# Patient Record
Sex: Female | Born: 1990 | Race: White | Hispanic: Yes | Marital: Single | Smoking: Current every day smoker
Health system: Southern US, Community
[De-identification: ages and names within clinical notes are randomized; demographics above are authoritative.]

## PROBLEM LIST (undated history)

## (undated) DIAGNOSIS — K219 Gastro-esophageal reflux disease without esophagitis: Secondary | ICD-10-CM

## (undated) HISTORY — PX: HERNIA REPAIR: SHX51

## (undated) HISTORY — DX: Gastro-esophageal reflux disease without esophagitis: K21.9

---
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
## 2012-11-07 ENCOUNTER — Encounter: Payer: Self-pay | Admitting: Internal Medicine

## 2013-01-20 ENCOUNTER — Ambulatory Visit (INDEPENDENT_AMBULATORY_CARE_PROVIDER_SITE_OTHER): Payer: BC Managed Care – PPO | Admitting: Family Medicine

## 2013-01-20 VITALS — BP 120/70 | HR 87 | Temp 98.8°F | Resp 16 | Ht 65.0 in | Wt 151.0 lb

## 2013-01-20 DIAGNOSIS — Z23 Encounter for immunization: Secondary | ICD-10-CM

## 2013-01-20 NOTE — Progress Notes (Signed)
  Subjective:    Patient ID: Brittany Frazier, female    DOB: [birth year], 22 y.o.   MRN: [id number]  HPI  Patient here from [place] and needs immunization review. She was told by the immigration authorities that she needed an MMR because of her titers were too low. She receives T. gap last Wednesday  Review of Systems     Objective:   Physical Exam We discussed immunization situation       Assessment & Plan:  Need for MMR vaccine - Plan: MMR vaccine subcutaneous

## 2013-05-07 ENCOUNTER — Ambulatory Visit (INDEPENDENT_AMBULATORY_CARE_PROVIDER_SITE_OTHER): Payer: BC Managed Care – PPO | Admitting: Physician Assistant

## 2013-05-07 VITALS — BP 122/64 | HR 66 | Temp 98.4°F | Resp 12 | Ht 65.0 in | Wt 163.0 lb

## 2013-05-07 DIAGNOSIS — Z9189 Other specified personal risk factors, not elsewhere classified: Secondary | ICD-10-CM

## 2013-05-07 DIAGNOSIS — Z111 Encounter for screening for respiratory tuberculosis: Secondary | ICD-10-CM

## 2013-05-07 DIAGNOSIS — Z2839 Other underimmunization status: Secondary | ICD-10-CM

## 2013-05-07 DIAGNOSIS — M549 Dorsalgia, unspecified: Secondary | ICD-10-CM

## 2013-05-07 DIAGNOSIS — Z0000 Encounter for general adult medical examination without abnormal findings: Secondary | ICD-10-CM

## 2013-05-07 NOTE — Progress Notes (Signed)
  Tuberculosis Risk Questionnaire  1. Were you born outside the [place] in one of the following parts of the world: [place], [place], [place], [place] or [place]?  Yes   2. Have you traveled outside the [place] and lived for more than one month in one of the following parts of the world: [place], [place], [place], [place] or [place]?  Yes   3. Do you have a compromised immune system such as from any of the following conditions:HIV/AIDS, organ or bone marrow transplantation, diabetes, immunosuppressive medicines (e.g. Prednisone, Remicaide), leukemia, lymphoma, cancer of the head or neck, gastrectomy or jejunal bypass, end-stage renal disease (on dialysis), or silicosis?  No   4. Have you ever or do you plan on working in: a residential care center, a health care facility, a jail or prison or homeless shelter?  No  5. Have you ever: injected illegal drugs, used crack cocaine, lived in a homeless shelter  or been in jail or prison?   No  6. Have you ever been exposed to anyone with infectious tuberculosis?  No   Tuberculosis Symptom Questionnaire  Do you currently have any of the following symptoms?  1. Unexplained cough lasting more than 3 weeks? No  2. Unexplained fever lasting more than 3 weeks. No   3. Night Sweats (sweating that leaves the bedclothes and sheets wet)   No  4. Shortness of Breath No  5. Chest Pain No  6. Unintentional weight loss  No  7. Unexplained fatigue (very tired for no reason) No

## 2013-05-07 NOTE — Patient Instructions (Addendum)
Contact the other office to get documentation of vaccines/titers they did on letterhead or with a stamp.  Keeping You Healthy  Get These Tests 1. Blood Pressure- Have your blood pressure checked once a year by your health care provider.  Normal blood pressure is 120/80. 2. Weight- Have your body mass index (BMI) calculated to screen for obesity.  BMI is measure of body fat based on height and weight.  You can also calculate your own BMI at https://www.west-esparza.com/. 3. Cholesterol- Have your cholesterol checked every 5 years starting at age 92 then yearly starting at age 36. 4. Chlamydia, HIV, and other sexually transmitted diseases- Get screened every year until age 72, then within three months of each new sexual provider. 5. Pap Smear- Beginning at age 42, every 1-3 years; discuss with your health care provider. 6. Mammogram- Every year starting at age 68  Take these medicines  Calcium with Vitamin D-Your body needs 1200 mg of Calcium each day and [phone] IU of Vitamin D daily.  Your body can only absorb 500 mg of Calcium at a time so Calcium must be taken in 2 or 3 divided doses throughout the day.  Multivitamin with folic acid- Once daily if it is possible for you to become pregnant.  Get these Immunizations  Gardasil-Series of three doses; prevents HPV related illness such as genital warts and cervical cancer.  Menactra-Single dose; prevents meningitis.  Tetanus shot- Every 10 years.  Flu shot-Every year.  Take these steps 1. Do not smoke-Your healthcare provider can help you quit.  For tips on how to quit go to www.smokefree.gov or call 1-800 QUITNOW. 2. Be physically active- Exercise 5 days a week for at least 30 minutes.  If you are not already physically active, start slow and gradually work up to 30 minutes of moderate physical activity.  Examples of moderate activity include walking briskly, dancing, swimming, bicycling, etc. 3. Breast Cancer- A self breast exam every month  is important for early detection of breast cancer.  For more information and instruction on self breast exams, ask your healthcare provider or [email or web address].[email or web address]. 4. Eat a healthy diet- Eat a variety of healthy foods such as fruits, vegetables, whole grains, low fat milk, low fat cheeses, yogurt, lean meats, poultry and fish, beans, nuts, tofu, etc.  For more information go to www. Thenutritionsource.org 5. Drink alcohol in moderation- Limit alcohol intake to one drink or less per day. Never drink and drive. 6. Depression- Your emotional health is as important as your physical health.  If you're feeling down or losing interest in things you normally enjoy please talk to your healthcare provider about being screened for depression. 7. Dental visit- Brush and floss your teeth twice daily; visit your dentist twice a year. 8. Eye doctor- Get an eye exam at least every 2 years. 9. Helmet use- Always wear a helmet when riding a bicycle, motorcycle, rollerblading or skateboarding. 10. Safe sex- If you may be exposed to sexually transmitted infections, use a condom. 11. Seat belts- Seat belts can save your live; always wear one. 12. Smoke/Carbon Monoxide detectors- These detectors need to be installed on the appropriate level of your home. Replace batteries at least once a year. 13. Skin cancer- When out in the sun please cover up and use sunscreen 15 SPF or higher. 14. Violence- If anyone is threatening or hurting you, please tell your healthcare provider.

## 2013-05-07 NOTE — Progress Notes (Signed)
  Subjective:    Patient ID: Brittany Frazier, female    DOB: 1991/01/31, 22 y.o.   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 Pt states she had a physical earlier this year for visa purposes. She is unable to remember the name of the practice but does know where it is located. She brings vaccine records from the aforementioned clinic, but they are not verifiable. States she is overall healthy, PMH of GERD, currently taking spirolactone Yaz and metformin for acne and weight gain (managed by an endocrinologist). Pt complains of back pain that worsens with activity, intermittent, right side of spine in lower back.   Review of Systems Denies: headache, dizziness, SOB, chest pain, abdominal pain, urinary symptoms    Objective:   Physical Exam  BP 122/64  Pulse 66  Temp(Src) 98.4 F (36.9 C) (Oral)  Resp 12  Ht 5\' 5"  (1.651 m)  Wt 163 lb (73.936 kg)  BMI 27.12 kg/m2  SpO2 100%  General: WDWN female, appears stated age, NAD Head: normocephalic, atraumatic Eyes: PERLA, sclera/conjunctiva clear Ears: TMs clear with visible bony landmarks Nose: turbinates normal, no visible discharge  Throat: moist mucous membranes, uvula midline, posterior pharynx is without erythema edema or exudate  Neck: supple, no palpable lymphadenopathy Resp: good air entry, CTA anterior & posterior fields bilaterally, without rales rhonchi wheezes Cardiac: RRR, S1S2 appreciated, no murmurs rubs gallops Breast: no palpable masses, no tenderness  Abdomen: normal bowel sounds, soft, non distended, non tender to palpation, no Murphys, no rebound, no guarding Extremities: moves all limbs spontaneously, radial & dorsalis pedis pulses intact, good capillary refill, 2+ reflexes in bicep brachioradialis patellar & achilles, lower back muscles have moderate tension, full ROM in spine, no step offs appreciated, no spinal bony tenderness Neuro: A&O x 3, CN II-XII grossly intact  Skin:  moderate cystic acne present on face, chest, back - skin is otherwise clear of rashes or lesions      Assessment & Plan:  Routine general medical examination at a health care facility  Immunizations incomplete - Plan: Hepatitis B surface antibody, Measles/Mumps/Rubella Immunity  Back pain  Suspect back pain is from weak abdominal muscles and the weight of breast tissue. Counseled patient on use of supportive bras and abdominal muscle strengthening.

## 2013-05-08 NOTE — Progress Notes (Signed)
I have examined this patient along with the student and agree.  Her school requires a 2-step PPD (with the doses no more than 30 days apart).  As her last PPD was 12/2012, we placed one today, and return for reading in 48-72 hours, then return in 1 week for the second step. She'll get the records from the other facility, with a clinic stamp or on letterhead, which will document immunity to varicella and her influenza vaccine. She had a booster dose of MMR 12/2012 here, and Tdap in 08/2007 (and another unverified dose 12/2012 at the other facility) and believes she had the Hep B vaccine series, so we'll get titers to verify immunity to MMR and Hep B.  I suspect that a lot of her back pain is due to very large breasts and the resultant strain.  She wears two bras to achieve adequate support.  Discussed the importance of supportive bras and appropriate fit, and core strength to help reduce her back pain.

## 2013-05-09 ENCOUNTER — Ambulatory Visit: Payer: BC Managed Care – PPO

## 2013-05-09 ENCOUNTER — Ambulatory Visit (INDEPENDENT_AMBULATORY_CARE_PROVIDER_SITE_OTHER): Payer: BC Managed Care – PPO | Admitting: Physician Assistant

## 2013-05-09 ENCOUNTER — Encounter: Payer: Self-pay | Admitting: Physician Assistant

## 2013-05-09 DIAGNOSIS — Z111 Encounter for screening for respiratory tuberculosis: Secondary | ICD-10-CM

## 2013-05-09 DIAGNOSIS — Z789 Other specified health status: Secondary | ICD-10-CM | POA: Insufficient documentation

## 2013-05-09 LAB — MEASLES/MUMPS/RUBELLA IMMUNITY
Mumps IgG: >300 AU/mL — ABNORMAL HIGH (ref <9.00)
Rubella: 6.59 Index — ABNORMAL HIGH (ref <0.90)
Rubeola IgG: 256 AU/mL — ABNORMAL HIGH (ref <25.00)

## 2013-05-09 LAB — RPR: RPR Ser-Titr: NONREACTIVE

## 2013-05-09 LAB — HEPATITIS B SURFACE ANTIBODY, QUANTITATIVE: Hepatitis B-Post: 67.7 m[IU]/mL

## 2013-05-09 NOTE — Progress Notes (Signed)
PPD reading only

## 2014-05-14 ENCOUNTER — Ambulatory Visit (INDEPENDENT_AMBULATORY_CARE_PROVIDER_SITE_OTHER): Payer: BC Managed Care – PPO | Admitting: Physician Assistant

## 2014-05-14 VITALS — BP 110/68 | HR 102 | Temp 98.0°F | Resp 18 | Ht 65.0 in | Wt 166.0 lb

## 2014-05-14 DIAGNOSIS — Z0000 Encounter for general adult medical examination without abnormal findings: Secondary | ICD-10-CM

## 2014-05-14 DIAGNOSIS — Z111 Encounter for screening for respiratory tuberculosis: Secondary | ICD-10-CM

## 2014-05-14 NOTE — Progress Notes (Signed)
   Subjective:    Patient ID: Brittany Frazier, female    DOB: April 22, 1991, 23 y.o.   MRN: [id number]  HPI  Brittany Frazier is a very pleasant 23 yr old female here for CPE.  Last CPE in 2014.  Needs ppw completed for school  Complaints:  none LMP:  04/13/14 - regular Contraception:  None Not currently sexually active, no concern for STI Dentist:  Once per year Eye doctor:  none Imm:  utd Diet:  Varied; limited junk food; cut out soda; mostly water to drink Exercise:  3x/wk Zumba Meds:  accutane Family history:  PGF with diabetes;  Tobacco: none Etoh:  None currently due to accutane  [facility] - radiography    Review of Systems  Constitutional: Negative for fever, chills, appetite change and fatigue.  Eyes: Negative for visual disturbance.  Respiratory: Negative.  Negative for cough and wheezing.   Cardiovascular: Negative for chest pain.  Gastrointestinal: Negative for nausea, vomiting and abdominal pain.  Genitourinary: Negative for dysuria, vaginal bleeding and vaginal discharge.  Musculoskeletal: Negative for arthralgias.  Skin: Negative.        Objective:   Physical Exam  Vitals reviewed. Constitutional: She is oriented to person, place, and time. She appears well-developed and well-nourished. No distress.  HENT:  Head: Normocephalic and atraumatic.  Right Ear: Tympanic membrane and ear canal normal.  Left Ear: Tympanic membrane and ear canal normal.  Mouth/Throat: Uvula is midline, oropharynx is clear and moist and mucous membranes are normal.  Eyes: Conjunctivae and EOM are normal. Pupils are equal, round, and reactive to light. No scleral icterus.  Neck: Normal range of motion. Neck supple.  Cardiovascular: Normal rate, regular rhythm and normal heart sounds.   No murmur heard. Pulmonary/Chest: Effort normal and breath sounds normal. She has no wheezes. She has no rales.  Abdominal: Soft. Bowel sounds are normal. There is no tenderness.  Lymphadenopathy:    She has no  cervical adenopathy.  Neurological: She is alert and oriented to person, place, and time. She has normal reflexes.  Skin: Skin is warm and dry.  Psychiatric: She has a normal mood and affect. Her behavior is normal.    Immunization History  Administered Date(s) Administered  . Influenza Split 01/19/2013  . MMR 01/20/2013  . PPD Test 01/19/2013, 05/07/2013  . Td 01/16/2013  . Tdap 08/04/2007       Assessment & Plan:  Routine general medical examination at a health care facility  Screening for tuberculosis - Plan: Quantiferon tb gold assay    Brittany Frazier is a very pleasant 23 yr old female here for CPE.  She reports good health and exam is normal today.  She is utd on required immunizations for school.  PPW complete and copies sent for scanning.  TB quantiferon gold sent per school requirements - will follow up on lab results.    Brittany Frazier MHS, PA-C [facility] 7/14/20159:10 PM

## 2014-05-14 NOTE — Patient Instructions (Signed)

## 2014-05-14 NOTE — Progress Notes (Signed)
  Tuberculosis Risk Questionnaire  1. Yes  Were you born outside the [place] in one of the following parts of the world: [place], [place], [place], [place] or [place]?    2. Yes  Have you traveled outside the [place] and lived for more than one month in one of the following parts of the world: [place], [place], [place], [place] or [place]?    3. No Do you have a compromised immune system such as from any of the following conditions:HIV/AIDS, organ or bone marrow transplantation, diabetes, immunosuppressive medicines (e.g. Prednisone, Remicaide), leukemia, lymphoma, cancer of the head or neck, gastrectomy or jejunal bypass, end-stage renal disease (on dialysis), or silicosis?     4. Yes  Have you ever or do you plan on working in: a residential care center, a health care facility, a jail or prison or homeless shelter?    5. No Have you ever: injected illegal drugs, used crack cocaine, lived in a homeless shelter  or been in jail or prison?     6. No Have you ever been exposed to anyone with infectious tuberculosis?    Tuberculosis Symptom Questionnaire  Do you currently have any of the following symptoms?  1. No Unexplained cough lasting more than 3 weeks?   2. No Unexplained fever lasting more than 3 weeks.   3. No Night Sweats (sweating that leaves the bedclothes and sheets wet)     4. No Shortness of Breath   5. No Chest Pain   6. No Unintentional weight loss    7. No Unexplained fatigue (very tired for no reason)   

## 2014-05-16 LAB — QUANTIFERON TB GOLD ASSAY (BLOOD)
INTERFERON GAMMA RELEASE ASSAY: NEGATIVE
QUANTIFERON TB AG MINUS NIL: 0 [IU]/mL
Quantiferon Nil Value: 0.02 IU/mL
TB Ag value: 0.02 IU/mL

## 2014-05-19 ENCOUNTER — Telehealth: Payer: Self-pay

## 2014-05-19 NOTE — Telephone Encounter (Signed)
PATIENT IS CALLING ABOUT HER BLOOD TEST RESULTS FOR [facility]. PLEASE CALL! [phone]

## 2014-05-19 NOTE — Telephone Encounter (Signed)
Pt called back. Copy of Quant Gold up front for p/u

## 2014-05-19 NOTE — Telephone Encounter (Signed)
Pt was notified of labs earlier. LM to inquire as to whether she wants to p/u a copy or if she wants us to mail her one.

## 2014-07-08 ENCOUNTER — Ambulatory Visit (INDEPENDENT_AMBULATORY_CARE_PROVIDER_SITE_OTHER): Payer: BC Managed Care – PPO | Admitting: *Deleted

## 2014-07-08 DIAGNOSIS — Z23 Encounter for immunization: Secondary | ICD-10-CM

## 2016-03-08 ENCOUNTER — Ambulatory Visit (INDEPENDENT_AMBULATORY_CARE_PROVIDER_SITE_OTHER): Payer: BC Managed Care – PPO | Admitting: Family Medicine

## 2016-03-08 VITALS — BP 118/80 | HR 79 | Temp 97.2°F | Resp 18 | Ht 65.0 in | Wt 174.0 lb

## 2016-03-08 DIAGNOSIS — R197 Diarrhea, unspecified: Secondary | ICD-10-CM | POA: Diagnosis not present

## 2016-03-08 DIAGNOSIS — R109 Unspecified abdominal pain: Secondary | ICD-10-CM

## 2016-03-08 DIAGNOSIS — R110 Nausea: Secondary | ICD-10-CM

## 2016-03-08 LAB — POCT CBC
Granulocyte percent: 63.8 %G (ref 37–80)
HCT, POC: 40.9 % (ref 37.7–47.9)
HEMOGLOBIN: 14.6 g/dL (ref 12.2–16.2)
LYMPH, POC: 2.1 (ref 0.6–3.4)
MCH: 33 pg — AB (ref 27–31.2)
MCHC: 35.6 g/dL — AB (ref 31.8–35.4)
MCV: 92.8 fL (ref 80–97)
MID (cbc): 0.3 (ref 0–0.9)
MPV: 7.8 fL (ref 0–99.8)
PLATELET COUNT, POC: 326 10*3/uL (ref 142–424)
POC Granulocyte: 4.3 (ref 2–6.9)
POC LYMPH PERCENT: 31.6 %L (ref 10–50)
POC MID %: 4.6 %M (ref 0–12)
RBC: 4.41 M/uL (ref 4.04–5.48)
RDW, POC: 12 %
WBC: 6.7 10*3/uL (ref 4.6–10.2)

## 2016-03-08 LAB — POCT URINALYSIS DIP (MANUAL ENTRY)
BILIRUBIN UA: NEGATIVE
GLUCOSE UA: NEGATIVE
Leukocytes, UA: NEGATIVE
Nitrite, UA: NEGATIVE
Protein Ur, POC: NEGATIVE
UROBILINOGEN UA: 0.2
pH, UA: 5.5

## 2016-03-08 LAB — POC MICROSCOPIC URINALYSIS (UMFC)

## 2016-03-08 MED ORDER — DICYCLOMINE HCL 10 MG PO CAPS: 10.0000 mg | ORAL_CAPSULE | Freq: Three times a day (TID) | ORAL | Status: DC

## 2016-03-08 MED ORDER — ONDANSETRON 4 MG PO TBDP: 4.0000 mg | ORAL_TABLET | Freq: Three times a day (TID) | ORAL | Status: DC | PRN

## 2016-03-08 NOTE — Progress Notes (Signed)
Patient ID: Brittany HellerDiana Frazier, female    DOB: [birth year]  Age: 25 y.o. MRN: [id number]  Chief Complaint  Patient presents with  . Diarrhea    x 3 days  . Nausea    x 2 days  . Abdominal Pain    Subjective:   Patient had a problem with her TMJ 6 weeks ago and was treated with some steroids at that time. After that she had the flu. That subsided and then about 10 days or 2 weeks ago she developed diarrhea. That has continued to persist with going to 12 times a day. She has had nausea without vomiting. She has low abdominal pain. She has minimized her eating. She has kept well hydrated. She has been on a ketogenic diet trying to lose some weight.  Current allergies, medications, problem list, past/family and social histories reviewed.  Objective:  BP 118/80 mmHg  Pulse 79  Temp(Src) 97.2 F (36.2 C) (Oral)  Resp 18  Ht 5\' 5"  (1.651 m)  Wt 174 lb (78.926 kg)  BMI 28.96 kg/m2  SpO2 99%  LMP 03/04/2016  No acute distress. Throat clear. TMs normal. Neck supple without nodes. Chest clear. Heart regular without murmur. Abdomen has normal bowel sounds. Soft without organomegaly or masses is a little tender across the lower abdomen.  Assessment & Plan:   Assessment: 1. Diarrhea, unspecified type   2. Nausea without vomiting   3. Abdominal pain, unspecified abdominal location       Plan: Check labs and stool cultures.  Orders Placed This Encounter  Procedures  . Stool culture  . Clostridium Difficile by PCR    Order Specific Question:  Is your patient experiencing loose or watery stools (3 or more in 24 hours)?    Answer:  Yes    Order Specific Question:  Has the patient received laxatives in the last 24 hours?    Answer:  No    Order Specific Question:  Has a negative Cdiff test resulted in the last 7 days?    Answer:  No  . POCT CBC  . POCT Microscopic Urinalysis (UMFC)  . POCT urinalysis dipstick    Meds ordered this encounter  Medications  . dicyclomine (BENTYL) 10 MG  capsule    Sig: Take 1 capsule (10 mg total) by mouth 4 (four) times daily -  before meals and at bedtime.    Dispense:  40 capsule    Refill:  1  . ondansetron (ZOFRAN ODT) 4 MG disintegrating tablet    Sig: Take 1 tablet (4 mg total) by mouth every 8 (eight) hours as needed for nausea or vomiting.    Dispense:  20 tablet    Refill:  0    Results for orders placed or performed in visit on 03/08/16  POCT CBC  Result Value Ref Range   WBC 6.7 4.6 - 10.2 K/uL   Lymph, poc 2.1 0.6 - 3.4   POC LYMPH PERCENT 31.6 10 - 50 %L   MID (cbc) 0.3 0 - 0.9   POC MID % 4.6 0 - 12 %M   POC Granulocyte 4.3 2 - 6.9   Granulocyte percent 63.8 37 - 80 %G   RBC 4.41 4.04 - 5.48 M/uL   Hemoglobin 14.6 12.2 - 16.2 g/dL   HCT, POC 40.940.9 81.137.7 - 47.9 %   MCV 92.8 80 - 97 fL   MCH, POC 33.0 (A) 27 - 31.2 pg   MCHC 35.6 (A) 31.8 - 35.4 g/dL   RDW,  POC 12.0 %   Platelet Count, POC 326 142 - 424 K/uL   MPV 7.8 0 - 99.8 fL  POCT Microscopic Urinalysis ([facility])  Result Value Ref Range   WBC,UR,HPF,POC None None WBC/hpf   RBC,UR,HPF,POC Few (A) None RBC/hpf   Bacteria Few (A) None, Too numerous to count   Mucus Present (A) Absent   Epithelial Cells, UR Per Microscopy None None, Too numerous to count cells/hpf  POCT urinalysis dipstick  Result Value Ref Range   Color, UA yellow yellow   Clarity, UA clear clear   Glucose, UA negative negative   Bilirubin, UA negative negative   Ketones, POC UA moderate (40) (A) negative   Spec Grav, UA <=1.005    Blood, UA small (A) negative   pH, UA 5.5    Protein Ur, POC negative negative   Urobilinogen, UA 0.2    Nitrite, UA Negative Negative   Leukocytes, UA Negative Negative    Nothing appears terribly out of the ordinary except for the ketones in the urine which I discussed with her. She is to take the dicyclomine and see if we can get her bowels back to more normal motility. Return if not improving. See orders and instructions.    Patient Instructions    Continue to keep yourself well hydrated  Take the dicyclomine 10 mg before meals and at bedtime as needed for abdominal pain and diarrhea. If necessary you can increase it to 20 mg.  Return if worse or not improving  We will try and let you know the results of your labs as soon as they become available.  IF you received an x-ray today, you will receive an invoice from [facility]. Please contact [facility] at [phone] with questions or concerns regarding your invoice.   IF you received labwork today, you will receive an invoice from [employer]. Please contact [facility] at [phone] with questions or concerns regarding your invoice.   Our billing staff will not be able to assist you with questions regarding bills from these companies.  You will be contacted with the lab results as soon as they are available. The fastest way to get your results is to activate your My Chart account. Instructions are located on the last page of this paperwork. If you have not heard from [place] regarding the results in 2 weeks, please contact this office.           Return if symptoms worsen or fail to improve.   Izaih Kataoka, MD 03/08/2016

## 2016-03-08 NOTE — Patient Instructions (Addendum)
Continue to keep yourself well hydrated  Take the dicyclomine 10 mg before meals and at bedtime as needed for abdominal pain and diarrhea. If necessary you can increase it to 20 mg.  Return if worse or not improving  We will try and let you know the results of your labs as soon as they become available.  IF you received an x-ray today, you will receive an invoice from [facility]. Please contact [facility] at [phone] with questions or concerns regarding your invoice.   IF you received labwork today, you will receive an invoice from [facility]. Please contact [facility] at [phone] with questions or concerns regarding your invoice.   Our billing staff will not be able to assist you with questions regarding bills from these companies.  You will be contacted with the lab results as soon as they are available. The fastest way to get your results is to activate your My Chart account. Instructions are located on the last page of this paperwork. If you have not heard from us regarding the results in 2 weeks, please contact this office.

## 2016-03-09 ENCOUNTER — Telehealth: Payer: Self-pay | Admitting: *Deleted

## 2016-03-09 DIAGNOSIS — A0472 Enterocolitis due to Clostridium difficile, not specified as recurrent: Secondary | ICD-10-CM

## 2016-03-09 LAB — CLOSTRIDIUM DIFFICILE BY PCR: Toxigenic C. Difficile by PCR: DETECTED — CR

## 2016-03-09 MED ORDER — METRONIDAZOLE 500 MG PO TABS: 500.0000 mg | ORAL_TABLET | Freq: Three times a day (TID) | ORAL | Status: AC

## 2016-03-09 NOTE — Telephone Encounter (Signed)
Spoke to pt and informed of results

## 2016-03-09 NOTE — Telephone Encounter (Signed)
[facility] called and stated that pt is positive for C-Diff.

## 2016-03-09 NOTE — Telephone Encounter (Signed)
I tried calling patient however voicemail was full and I could not leave a message. She was positive for C diff.  I have called in metronidazole for her -- she will need to take three times a day for the next 10 days. She needs to be aware that she will get very sick if she drinks alcohol while on this medication. She should take antibiotic with either daily yogurt or a probiotic tablet to protect her gut. Be very diligent about hand washing to prevent transmission. Return if symptoms worsen or symptoms not improved after treatment complete.

## 2016-03-12 LAB — STOOL CULTURE

## 2016-03-25 ENCOUNTER — Telehealth: Payer: Self-pay

## 2016-03-25 NOTE — Telephone Encounter (Signed)
Pt wants to follow up from 2 weeks ago.Today she is feeling great since she took her antibiotics. She will be finishing her last pill today. She does have minor stomach problems possibly due to stress but overall she feels way better than 2 weeks ago.  Please advise if needed  [phone]

## 2018-02-06 DIAGNOSIS — F902 Attention-deficit hyperactivity disorder, combined type: Secondary | ICD-10-CM | POA: Diagnosis not present

## 2018-02-06 DIAGNOSIS — F329 Major depressive disorder, single episode, unspecified: Secondary | ICD-10-CM | POA: Diagnosis not present

## 2018-02-06 DIAGNOSIS — F411 Generalized anxiety disorder: Secondary | ICD-10-CM | POA: Diagnosis not present

## 2018-04-07 ENCOUNTER — Ambulatory Visit (INDEPENDENT_AMBULATORY_CARE_PROVIDER_SITE_OTHER): Payer: 59 | Admitting: Physician Assistant

## 2018-04-07 ENCOUNTER — Other Ambulatory Visit: Payer: Self-pay

## 2018-04-07 ENCOUNTER — Encounter: Payer: Self-pay | Admitting: Physician Assistant

## 2018-04-07 VITALS — BP 110/68 | Temp 98.4°F | Resp 16 | Ht 65.0 in | Wt 142.8 lb

## 2018-04-07 DIAGNOSIS — Z113 Encounter for screening for infections with a predominantly sexual mode of transmission: Secondary | ICD-10-CM | POA: Diagnosis not present

## 2018-04-07 DIAGNOSIS — Z1329 Encounter for screening for other suspected endocrine disorder: Secondary | ICD-10-CM | POA: Diagnosis not present

## 2018-04-07 DIAGNOSIS — Z13228 Encounter for screening for other metabolic disorders: Secondary | ICD-10-CM | POA: Diagnosis not present

## 2018-04-07 DIAGNOSIS — Z0000 Encounter for general adult medical examination without abnormal findings: Secondary | ICD-10-CM

## 2018-04-07 DIAGNOSIS — Z130 Encounter for screening for diseases of the blood and blood-forming organs and certain disorders involving the immune mechanism: Secondary | ICD-10-CM

## 2018-04-07 DIAGNOSIS — Z309 Encounter for contraceptive management, unspecified: Secondary | ICD-10-CM | POA: Diagnosis not present

## 2018-04-07 DIAGNOSIS — Z13220 Encounter for screening for lipoid disorders: Secondary | ICD-10-CM | POA: Diagnosis not present

## 2018-04-07 LAB — POCT URINALYSIS DIP (MANUAL ENTRY)
Bilirubin, UA: NEGATIVE
Glucose, UA: NEGATIVE mg/dL
Ketones, POC UA: NEGATIVE mg/dL
Nitrite, UA: NEGATIVE
Protein Ur, POC: NEGATIVE mg/dL
Spec Grav, UA: 1.01 (ref 1.010–1.025)
Urobilinogen, UA: 0.2 E.U./dL
pH, UA: 6.5 (ref 5.0–8.0)

## 2018-04-07 LAB — POCT URINE PREGNANCY: Preg Test, Ur: NEGATIVE

## 2018-04-07 MED ORDER — NORETHIN ACE-ETH ESTRAD-FE 1-20 MG-MCG PO TABS: 1.0000 | ORAL_TABLET | Freq: Every day | ORAL | 11 refills | Status: DC

## 2018-04-07 NOTE — Patient Instructions (Addendum)
We will contact you with the results of your lab work.   Health Maintenance, Female Adopting a healthy lifestyle and getting preventive care can go a long way to promote health and wellness. Talk with your health care provider about what schedule of regular examinations is right for you. This is a good chance for you to check in with your provider about disease prevention and staying healthy. In between checkups, there are plenty of things you can do on your own. Experts have done a lot of research about which lifestyle changes and preventive measures are most likely to keep you healthy. Ask your health care provider for more information. Weight and diet Eat a healthy diet  Be sure to include plenty of vegetables, fruits, low-fat dairy products, and lean protein.  Do not eat a lot of foods high in solid fats, added sugars, or salt.  Get regular exercise. This is one of the most important things you can do for your health. ? Most adults should exercise for at least 150 minutes each week. The exercise should increase your heart rate and make you sweat (moderate-intensity exercise). ? Most adults should also do strengthening exercises at least twice a week. This is in addition to the moderate-intensity exercise.  Maintain a healthy weight  Body mass index (BMI) is a measurement that can be used to identify possible weight problems. It estimates body fat based on height and weight. Your health care provider can help determine your BMI and help you achieve or maintain a healthy weight.  For females 97 years of age and older: ? A BMI below 18.5 is considered underweight. ? A BMI of 18.5 to 24.9 is normal. ? A BMI of 25 to 29.9 is considered overweight. ? A BMI of 30 and above is considered obese.  Watch levels of cholesterol and blood lipids  You should start having your blood tested for lipids and cholesterol at 25 years of age, then have this test every 5 years.  You may need to have your  cholesterol levels checked more often if: ? Your lipid or cholesterol levels are high. ? You are older than 25 years of age. ? You are at high risk for heart disease.  Cancer screening Lung Cancer  Lung cancer screening is recommended for adults 73-35 years old who are at high risk for lung cancer because of a history of smoking.  A yearly low-dose CT scan of the lungs is recommended for people who: ? Currently smoke. ? Have quit within the past 15 years. ? Have at least a 30-pack-year history of smoking. A pack year is smoking an average of one pack of cigarettes a day for 1 year.  Yearly screening should continue until it has been 15 years since you quit.  Yearly screening should stop if you develop a health problem that would prevent you from having lung cancer treatment.  Breast Cancer  Practice breast self-awareness. This means understanding how your breasts normally appear and feel.  It also means doing regular breast self-exams. Let your health care provider know about any changes, no matter how small.  If you are in your 20s or 30s, you should have a clinical breast exam (CBE) by a health care provider every 1-3 years as part of a regular health exam.  If you are 32 or older, have a CBE every year. Also consider having a breast X-ray (mammogram) every year.  If you have a family history of breast cancer, talk to your  health care provider about genetic screening.  If you are at high risk for breast cancer, talk to your health care provider about having an MRI and a mammogram every year.  Breast cancer gene (BRCA) assessment is recommended for women who have family members with BRCA-related cancers. BRCA-related cancers include: ? Breast. ? Ovarian. ? Tubal. ? Peritoneal cancers.  Results of the assessment will determine the need for genetic counseling and BRCA1 and BRCA2 testing.  Cervical Cancer Your health care provider may recommend that you be screened regularly  for cancer of the pelvic organs (ovaries, uterus, and vagina). This screening involves a pelvic examination, including checking for microscopic changes to the surface of your cervix (Pap test). You may be encouraged to have this screening done every 3 years, beginning at age 79.  For women ages 29-65, health care providers may recommend pelvic exams and Pap testing every 3 years, or they may recommend the Pap and pelvic exam, combined with testing for human papilloma virus (HPV), every 5 years. Some types of HPV increase your risk of cervical cancer. Testing for HPV may also be done on women of any age with unclear Pap test results.  Other health care providers may not recommend any screening for nonpregnant women who are considered low risk for pelvic cancer and who do not have symptoms. Ask your health care provider if a screening pelvic exam is right for you.  If you have had past treatment for cervical cancer or a condition that could lead to cancer, you need Pap tests and screening for cancer for at least 20 years after your treatment. If Pap tests have been discontinued, your risk factors (such as having a new sexual partner) need to be reassessed to determine if screening should resume. Some women have medical problems that increase the chance of getting cervical cancer. In these cases, your health care provider may recommend more frequent screening and Pap tests.  Colorectal Cancer  This type of cancer can be detected and often prevented.  Routine colorectal cancer screening usually begins at 25 years of age and continues through 25 years of age.  Your health care provider may recommend screening at an earlier age if you have risk factors for colon cancer.  Your health care provider may also recommend using home test kits to check for hidden blood in the stool.  A small camera at the end of a tube can be used to examine your colon directly (sigmoidoscopy or colonoscopy). This is done to  check for the earliest forms of colorectal cancer.  Routine screening usually begins at age 48.  Direct examination of the colon should be repeated every 5-10 years through 25 years of age. However, you may need to be screened more often if early forms of precancerous polyps or small growths are found.  Skin Cancer  Check your skin from head to toe regularly.  Tell your health care provider about any new moles or changes in moles, especially if there is a change in a mole's shape or color.  Also tell your health care provider if you have a mole that is larger than the size of a pencil eraser.  Always use sunscreen. Apply sunscreen liberally and repeatedly throughout the day.  Protect yourself by wearing long sleeves, pants, a wide-brimmed hat, and sunglasses whenever you are outside.  Heart disease, diabetes, and high blood pressure  High blood pressure causes heart disease and increases the risk of stroke. High blood pressure is more likely to  develop in: ? People who have blood pressure in the high end of the normal range (130-139/85-89 mm Hg). ? People who are overweight or obese. ? People who are African American.  If you are 33-59 years of age, have your blood pressure checked every 3-5 years. If you are 66 years of age or older, have your blood pressure checked every year. You should have your blood pressure measured twice-once when you are at a hospital or clinic, and once when you are not at a hospital or clinic. Record the average of the two measurements. To check your blood pressure when you are not at a hospital or clinic, you can use: ? An automated blood pressure machine at a pharmacy. ? A home blood pressure monitor.  If you are between 20 years and 51 years old, ask your health care provider if you should take aspirin to prevent strokes.  Have regular diabetes screenings. This involves taking a blood sample to check your fasting blood sugar level. ? If you are at a  normal weight and have a low risk for diabetes, have this test once every three years after 25 years of age. ? If you are overweight and have a high risk for diabetes, consider being tested at a younger age or more often. Preventing infection Hepatitis B  If you have a higher risk for hepatitis B, you should be screened for this virus. You are considered at high risk for hepatitis B if: ? You were born in a country where hepatitis B is common. Ask your health care provider which countries are considered high risk. ? Your parents were born in a high-risk country, and you have not been immunized against hepatitis B (hepatitis B vaccine). ? You have HIV or AIDS. ? You use needles to inject street drugs. ? You live with someone who has hepatitis B. ? You have had sex with someone who has hepatitis B. ? You get hemodialysis treatment. ? You take certain medicines for conditions, including cancer, organ transplantation, and autoimmune conditions.  Hepatitis C  Blood testing is recommended for: ? Everyone born from [birth year] through 1963. ? Anyone with known risk factors for hepatitis C.  Sexually transmitted infections (STIs)  You should be screened for sexually transmitted infections (STIs) including gonorrhea and chlamydia if: ? You are sexually active and are younger than 25 years of age. ? You are older than 25 years of age and your health care provider tells you that you are at risk for this type of infection. ? Your sexual activity has changed since you were last screened and you are at an increased risk for chlamydia or gonorrhea. Ask your health care provider if you are at risk.  If you do not have HIV, but are at risk, it may be recommended that you take a prescription medicine daily to prevent HIV infection. This is called pre-exposure prophylaxis (PrEP). You are considered at risk if: ? You are sexually active and do not regularly use condoms or know the HIV status of your  partner(s). ? You take drugs by injection. ? You are sexually active with a partner who has HIV.  Talk with your health care provider about whether you are at high risk of being infected with HIV. If you choose to begin PrEP, you should first be tested for HIV. You should then be tested every 3 months for as long as you are taking PrEP. Pregnancy  If you are premenopausal and you may become pregnant,  ask your health care provider about preconception counseling.  If you may become pregnant, take 400 to 800 micrograms (mcg) of folic acid every day.  If you want to prevent pregnancy, talk to your health care provider about birth control (contraception). Osteoporosis and menopause  Osteoporosis is a disease in which the bones lose minerals and strength with aging. This can result in serious bone fractures. Your risk for osteoporosis can be identified using a bone density scan.  If you are 63 years of age or older, or if you are at risk for osteoporosis and fractures, ask your health care provider if you should be screened.  Ask your health care provider whether you should take a calcium or vitamin D supplement to lower your risk for osteoporosis.  Menopause may have certain physical symptoms and risks.  Hormone replacement therapy may reduce some of these symptoms and risks. Talk to your health care provider about whether hormone replacement therapy is right for you. Follow these instructions at home:  Schedule regular health, dental, and eye exams.  Stay current with your immunizations.  Do not use any tobacco products including cigarettes, chewing tobacco, or electronic cigarettes.  If you are pregnant, do not drink alcohol.  If you are breastfeeding, limit how much and how often you drink alcohol.  Limit alcohol intake to no more than 1 drink per day for nonpregnant women. One drink equals 12 ounces of beer, 5 ounces of wine, or 1 ounces of hard liquor.  Do not use street  drugs.  Do not share needles.  Ask your health care provider for help if you need support or information about quitting drugs.  Tell your health care provider if you often feel depressed.  Tell your health care provider if you have ever been abused or do not feel safe at home. This information is not intended to replace advice given to you by your health care provider. Make sure you discuss any questions you have with your health care provider. Document Released: 05/03/2011 Document Revised: 03/25/2016 Document Reviewed: 07/22/2015 Elsevier Interactive Patient Education  2018 [employer].   IF you received an x-ray today, you will receive an invoice from [facility]. Please contact [facility] at [phone] with questions or concerns regarding your invoice.   IF you received labwork today, you will receive an invoice from [facility]. Please contact [facility] at [phone] with questions or concerns regarding your invoice.   Our billing staff will not be able to assist you with questions regarding bills from these companies.  You will be contacted with the lab results as soon as they are available. The fastest way to get your results is to activate your My Chart account. Instructions are located on the last page of this paperwork. If you have not heard from [place] regarding the results in 2 weeks, please contact this office.

## 2018-04-07 NOTE — Progress Notes (Signed)
[facility], [place] [address] [phone]  Date:  04/07/2018   Name:  Brittany Frazier   DOB:  11-10-1990   MRN:  [id number]  PCP:  Patient, No Pcp Per    Chief Complaint: Annual Exam (no pap)   History of Present Illness:  This is a 27 y.o. female with PMH  has a past medical history of GERD (gastroesophageal reflux disease). who is presenting for CPE. 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   She has appt with dermatologist in July for facial acne.   Axiety - propranolol 53m  Insomnia - trazadone 593m  Complaints: none  LMP: 03/19/2018. Periods are regular  Contraception: OCPs Junel. Her boyfriend was unfaithful about one month ago. She is unsure if he used a condom. She is unsure if he's been tested for STDs. She is asymptomatic and would like routine screening today.  Last pap: 04/20/2016 - negative Sexual history: active with one female partner  Immunizations: utd  Eye: 20/20 b/l  Diet/Exercise: healthy eating habits and regular exercise.  Fam hx: family history includes Diabetes in her maternal grandfather and paternal grandfather; Heart disease in her maternal grandfather; Stroke in her paternal grandfather.  Tobacco/alcohol/substance use: Smoking status: Current Every Day Smoker -- 1/2 packs/day x 4 years. Drinks 1 alcholic drink/week   Review of Systems:  Review of Systems  Constitutional: Negative for chills, diaphoresis, fatigue and fever.  HENT: Negative for congestion, postnasal drip, rhinorrhea, sinus pressure, sneezing and sore throat.   Respiratory: Negative for cough, chest tightness, shortness of breath and wheezing.   Cardiovascular: Negative for chest pain and palpitations.  Gastrointestinal: Negative for abdominal pain, diarrhea, nausea and vomiting.  Genitourinary: Negative for decreased urine volume, difficulty urinating, dyspareunia, dysuria, enuresis, flank pain, frequency, hematuria, pelvic pain, urgency,  vaginal bleeding, vaginal discharge and vaginal pain.  Musculoskeletal: Negative for back pain.  Neurological: Negative for dizziness, weakness, light-headedness and headaches.    Patient Active Problem List   Diagnosis Date Noted  . Immune to hepatitis B 05/09/2013  . Immune to varicella 05/09/2013  . Immune to measles 05/09/2013  . Immune to mumps 05/09/2013  . Immune to rubella 05/09/2013    Prior to Admission medications   Medication Sig Start Date End Date Taking? Authorizing Provider  amphetamine-dextroamphetamine (ADDERALL) 20 MG tablet Take 20 mg by mouth daily.   Yes [provider]  Doxylamine Succinate, Sleep, (SLEEP AID PO) Take by mouth.   Yes [provider]  famotidine (PEPCID) 10 MG tablet Take 10 mg by mouth 2 (two) times daily.   Yes [provider]  naproxen sodium (ALEVE) 220 MG tablet Take 220 mg by mouth.   Yes [provider]  norethindrone-ethinyl estradiol (JUNEL FE,GILDESS FE,LOESTRIN FE) 1-20 MG-MCG tablet Take 1 tablet by mouth daily.   Yes [provider]  dicyclomine (BENTYL) 10 MG capsule Take 1 capsule (10 mg total) by mouth 4 (four) times daily -  before meals and at bedtime. Patient not taking: Reported on 04/07/2018 03/08/16   HoPosey BoyerMD  ISOtretinoin (ACCUTANE) 40 MG capsule Take 30 mg by mouth 2 (two) times daily.    [provider]    Allergies  Allergen Reactions  . Sulfa Antibiotics Rash    Past Surgical History:  Procedure Laterality Date  . HERNIA REPAIR      Social History   Tobacco Use  . Smoking status: Current Every Day Smoker  .  Smokeless tobacco: Never Used  Substance Use Topics  . Alcohol use: Yes    Alcohol/week: 0.6 oz    Types: 1 Standard drinks or equivalent per week  . Drug use: No    Family History  Problem Relation Age of Onset  . Diabetes Paternal Grandfather     Medication list has been reviewed and updated.  Physical Examination:  Physical Exam   Constitutional: She is oriented to person, place, and time. She appears well-developed and well-nourished. No distress.  HENT:  Head: Normocephalic and atraumatic.  Mouth/Throat: Oropharynx is clear and moist.  Eyes: Pupils are equal, round, and reactive to light. Conjunctivae and EOM are normal.  Neck: Normal range of motion. Neck supple. No thyromegaly present.  Cardiovascular: Normal rate, regular rhythm and normal heart sounds.  No murmur heard. Pulmonary/Chest: Effort normal and breath sounds normal. She has no wheezes.  Abdominal: Soft. Bowel sounds are normal. She exhibits no mass.  Musculoskeletal: Normal range of motion.  Neurological: She is alert and oriented to person, place, and time. She has normal reflexes.  Skin: Skin is warm and dry.  Facial acne, cystic   Psychiatric: She has a normal mood and affect. Her behavior is normal. Judgment and thought content normal.  Vitals reviewed.   BP 110/68 (BP Location: Left Arm, Patient Position: Sitting, Cuff Size: Normal)   Temp 98.4 F (36.9 C) (Oral)   Resp 16   Ht '5\' 5"'  (1.651 m)   Wt 142 lb 12.8 oz (64.8 kg)   LMP 03/19/2018   BMI 23.76 kg/m   Assessment and Plan: 1. Annual physical exam - pt presents for annual exam. She is feeling well today. Next PAP due 04/2019. UTD on immunizations. Routine labs are pending. Will contact with results. OCPs refilled x 12 months. hcg is pending.  2. Screening, lipid - Lipid panel  3. Screening for endocrine, metabolic and immunity disorder - CMP14+EGFR - CBC with Differential/Platelet - POCT urinalysis dipstick  4. Encounter for contraceptive management, unspecified type - norethindrone-ethinyl estradiol (JUNEL FE,GILDESS FE,LOESTRIN FE) 1-20 MG-MCG tablet; Take 1 tablet by mouth daily.  Dispense: 1 Package; Refill: 11 - POCT urine pregnancy  5. Screen for STD (sexually transmitted disease) - Chlamydia/Gonococcus/Trichomonas, NAA - HIV antibody (with reflex)   Mercer Pod, PA-C  [facility] 04/07/2018 2:22 PM

## 2018-04-08 LAB — CBC WITH DIFFERENTIAL/PLATELET
Basophils Absolute: 0 10*3/uL (ref 0.0–0.2)
Basos: 1 %
EOS (ABSOLUTE): 0.1 10*3/uL (ref 0.0–0.4)
Eos: 3 %
Hematocrit: 40.6 % (ref 34.0–46.6)
Hemoglobin: 13.6 g/dL (ref 11.1–15.9)
Immature Grans (Abs): 0 10*3/uL (ref 0.0–0.1)
Immature Granulocytes: 0 %
Lymphocytes Absolute: 1.7 10*3/uL (ref 0.7–3.1)
Lymphs: 31 %
MCH: 32.7 pg (ref 26.6–33.0)
MCHC: 33.5 g/dL (ref 31.5–35.7)
MCV: 98 fL — ABNORMAL HIGH (ref 79–97)
Monocytes Absolute: 0.5 10*3/uL (ref 0.1–0.9)
Monocytes: 9 %
Neutrophils Absolute: 3.1 10*3/uL (ref 1.4–7.0)
Neutrophils: 56 %
Platelets: 323 10*3/uL (ref 150–450)
RBC: 4.16 x10E6/uL (ref 3.77–5.28)
RDW: 13 % (ref 12.3–15.4)
WBC: 5.4 10*3/uL (ref 3.4–10.8)

## 2018-04-08 LAB — CMP14+EGFR
ALT: 18 IU/L (ref 0–32)
AST: 20 IU/L (ref 0–40)
Albumin/Globulin Ratio: 1.8 (ref 1.2–2.2)
Albumin: 4.4 g/dL (ref 3.5–5.5)
Alkaline Phosphatase: 56 IU/L (ref 39–117)
BUN/Creatinine Ratio: 14 (ref 9–23)
BUN: 8 mg/dL (ref 6–20)
Bilirubin Total: <0.2 mg/dL (ref 0.0–1.2)
CO2: 22 mmol/L (ref 20–29)
Calcium: 9.3 mg/dL (ref 8.7–10.2)
Chloride: 102 mmol/L (ref 96–106)
Creatinine, Ser: 0.59 mg/dL (ref 0.57–1.00)
GFR calc Af Amer: 148 mL/min/{1.73_m2} (ref >59)
GFR calc non Af Amer: 129 mL/min/{1.73_m2} (ref >59)
Globulin, Total: 2.4 g/dL (ref 1.5–4.5)
Glucose: 102 mg/dL — ABNORMAL HIGH (ref 65–99)
Potassium: 4.2 mmol/L (ref 3.5–5.2)
Sodium: 138 mmol/L (ref 134–144)
Total Protein: 6.8 g/dL (ref 6.0–8.5)

## 2018-04-08 LAB — LIPID PANEL
Chol/HDL Ratio: 3 ratio (ref 0.0–4.4)
Cholesterol, Total: 170 mg/dL (ref 100–199)
HDL: 57 mg/dL (ref >39)
LDL Calculated: 105 mg/dL — ABNORMAL HIGH (ref 0–99)
Triglycerides: 38 mg/dL (ref 0–149)
VLDL Cholesterol Cal: 8 mg/dL (ref 5–40)

## 2018-04-08 LAB — CHLAMYDIA/GONOCOCCUS/TRICHOMONAS, NAA
Chlamydia by NAA: NEGATIVE
Gonococcus by NAA: NEGATIVE
Trich vag by NAA: NEGATIVE

## 2018-04-08 LAB — HIV ANTIBODY (ROUTINE TESTING W REFLEX): HIV Screen 4th Generation wRfx: NONREACTIVE

## 2018-04-11 ENCOUNTER — Encounter: Payer: Self-pay | Admitting: Physician Assistant

## 2018-04-11 NOTE — Progress Notes (Signed)
Result letter sent in mail. Labs look great.

## 2018-05-08 DIAGNOSIS — F902 Attention-deficit hyperactivity disorder, combined type: Secondary | ICD-10-CM | POA: Diagnosis not present

## 2018-05-08 DIAGNOSIS — F411 Generalized anxiety disorder: Secondary | ICD-10-CM | POA: Diagnosis not present

## 2018-05-08 DIAGNOSIS — F329 Major depressive disorder, single episode, unspecified: Secondary | ICD-10-CM | POA: Diagnosis not present

## 2018-05-17 DIAGNOSIS — L709 Acne, unspecified: Secondary | ICD-10-CM | POA: Diagnosis not present

## 2018-05-17 DIAGNOSIS — L732 Hidradenitis suppurativa: Secondary | ICD-10-CM | POA: Diagnosis not present

## 2018-07-06 DIAGNOSIS — F411 Generalized anxiety disorder: Secondary | ICD-10-CM | POA: Diagnosis not present

## 2018-07-06 DIAGNOSIS — F329 Major depressive disorder, single episode, unspecified: Secondary | ICD-10-CM | POA: Diagnosis not present

## 2018-07-06 DIAGNOSIS — F902 Attention-deficit hyperactivity disorder, combined type: Secondary | ICD-10-CM | POA: Diagnosis not present

## 2018-07-24 DIAGNOSIS — L700 Acne vulgaris: Secondary | ICD-10-CM | POA: Diagnosis not present

## 2018-07-24 DIAGNOSIS — L722 Steatocystoma multiplex: Secondary | ICD-10-CM | POA: Diagnosis not present

## 2018-08-14 DIAGNOSIS — F329 Major depressive disorder, single episode, unspecified: Secondary | ICD-10-CM | POA: Diagnosis not present

## 2018-08-14 DIAGNOSIS — F902 Attention-deficit hyperactivity disorder, combined type: Secondary | ICD-10-CM | POA: Diagnosis not present

## 2018-08-14 DIAGNOSIS — F411 Generalized anxiety disorder: Secondary | ICD-10-CM | POA: Diagnosis not present

## 2018-08-23 DIAGNOSIS — L709 Acne, unspecified: Secondary | ICD-10-CM | POA: Diagnosis not present

## 2018-08-23 DIAGNOSIS — Z5181 Encounter for therapeutic drug level monitoring: Secondary | ICD-10-CM | POA: Diagnosis not present

## 2018-09-25 DIAGNOSIS — L709 Acne, unspecified: Secondary | ICD-10-CM | POA: Diagnosis not present

## 2018-09-25 DIAGNOSIS — K130 Diseases of lips: Secondary | ICD-10-CM | POA: Diagnosis not present

## 2018-09-25 DIAGNOSIS — Z5181 Encounter for therapeutic drug level monitoring: Secondary | ICD-10-CM | POA: Diagnosis not present

## 2018-10-26 DIAGNOSIS — L709 Acne, unspecified: Secondary | ICD-10-CM | POA: Diagnosis not present

## 2018-10-26 DIAGNOSIS — K130 Diseases of lips: Secondary | ICD-10-CM | POA: Diagnosis not present

## 2018-10-26 DIAGNOSIS — Z5181 Encounter for therapeutic drug level monitoring: Secondary | ICD-10-CM | POA: Diagnosis not present

## 2018-11-09 DIAGNOSIS — F329 Major depressive disorder, single episode, unspecified: Secondary | ICD-10-CM | POA: Diagnosis not present

## 2018-11-09 DIAGNOSIS — F902 Attention-deficit hyperactivity disorder, combined type: Secondary | ICD-10-CM | POA: Diagnosis not present

## 2018-11-09 DIAGNOSIS — F411 Generalized anxiety disorder: Secondary | ICD-10-CM | POA: Diagnosis not present

## 2018-11-27 DIAGNOSIS — Z5181 Encounter for therapeutic drug level monitoring: Secondary | ICD-10-CM | POA: Diagnosis not present

## 2018-11-27 DIAGNOSIS — K130 Diseases of lips: Secondary | ICD-10-CM | POA: Diagnosis not present

## 2018-11-27 DIAGNOSIS — L709 Acne, unspecified: Secondary | ICD-10-CM | POA: Diagnosis not present

## 2018-12-27 DIAGNOSIS — L709 Acne, unspecified: Secondary | ICD-10-CM | POA: Diagnosis not present

## 2018-12-27 DIAGNOSIS — Z5181 Encounter for therapeutic drug level monitoring: Secondary | ICD-10-CM | POA: Diagnosis not present

## 2018-12-27 DIAGNOSIS — K130 Diseases of lips: Secondary | ICD-10-CM | POA: Diagnosis not present

## 2019-02-05 DIAGNOSIS — Z5181 Encounter for therapeutic drug level monitoring: Secondary | ICD-10-CM | POA: Diagnosis not present

## 2019-02-05 DIAGNOSIS — K130 Diseases of lips: Secondary | ICD-10-CM | POA: Diagnosis not present

## 2019-02-05 DIAGNOSIS — L709 Acne, unspecified: Secondary | ICD-10-CM | POA: Diagnosis not present

## 2019-02-27 DIAGNOSIS — F902 Attention-deficit hyperactivity disorder, combined type: Secondary | ICD-10-CM | POA: Diagnosis not present

## 2019-02-27 DIAGNOSIS — F411 Generalized anxiety disorder: Secondary | ICD-10-CM | POA: Diagnosis not present

## 2019-02-27 DIAGNOSIS — F329 Major depressive disorder, single episode, unspecified: Secondary | ICD-10-CM | POA: Diagnosis not present

## 2019-04-26 ENCOUNTER — Other Ambulatory Visit: Payer: Self-pay

## 2019-05-08 ENCOUNTER — Other Ambulatory Visit: Payer: Self-pay

## 2019-05-11 ENCOUNTER — Other Ambulatory Visit: Payer: Self-pay

## 2019-05-11 ENCOUNTER — Ambulatory Visit: Payer: 59 | Admitting: Internal Medicine

## 2019-05-11 ENCOUNTER — Encounter: Payer: Self-pay | Admitting: Internal Medicine

## 2019-05-11 VITALS — BP 148/78 | HR 114 | Temp 99.8°F | Ht 65.0 in | Wt 148.4 lb

## 2019-05-11 DIAGNOSIS — R7989 Other specified abnormal findings of blood chemistry: Secondary | ICD-10-CM

## 2019-05-11 DIAGNOSIS — L680 Hirsutism: Secondary | ICD-10-CM | POA: Diagnosis not present

## 2019-05-11 DIAGNOSIS — R7303 Prediabetes: Secondary | ICD-10-CM | POA: Diagnosis not present

## 2019-05-11 NOTE — Progress Notes (Signed)
Name: Brittany Frazier  MRN/ DOB: [id number], 11-27-1990    Age/ Sex: 28 y.o., female    PCP: Patient, No Pcp Per   Reason for Endocrinology Evaluation: Elevated testosterone     Date of Initial Endocrinology Evaluation: 05/13/2019     HPI: [name] is a 28 y.o. female with a past medical history of Cystic acne( on isotretinoin), hidradenitis suppurativa (previously on humira)and steatocytoma multiplex , ADD. The patient presented for initial endocrinology clinic visit on 05/13/2019 for consultative assistance with her Hx of elevated testosterone.   Pt was referred by Dr. Sindy Messingarfeen (Dermatolgoy ) for previously elevated  Testosterone. She has seen Dr. Talmage NapBalan in the past.  She was prescribed metformin at the time due to a diagnosis of pre-diabtes but was lost to follow up and did not continue to take it.   She is being treated for severe cystic acne, and steatocytoma multiplex. She was on accutane and spironolactone 50 mg daily for~ 8 months through dermatology   Menarche at [age] yrs of age with regular period  But with  dysmenorrhea  Has been  on Junel for 2 yrs.   Today her main concerns is excessive hair growth on her face, knuckles, abdomen, lower back , this is chronic and has been table since elementary school . She also continueso have issues with severe acne but this under better control at this time.    Currently shaves weekly last time was today   She was up to 200 lbs in college but has been able to lose weight since  2018 .   Brittany Frazier with hirsutism as well and weight issues.    No plans to conceive.    Denies any    HISTORY:  Past Medical History:  Past Medical History:  Diagnosis Date  . GERD (gastroesophageal reflux disease)    Past Surgical History:  Past Surgical History:  Procedure Laterality Date  . HERNIA REPAIR        Social History:  reports that she has been smoking. She has never used smokeless tobacco. She reports current  alcohol use of about 1.0 standard drinks of alcohol per week. She reports that she does not use drugs.  Family History: family history includes Diabetes in her maternal grandfather and paternal grandfather; Heart disease in her maternal grandfather; Stroke in her paternal grandfather.   HOME MEDICATIONS: Allergies as of 05/11/2019      Reactions   Sulfa Antibiotics Rash      Medication List       Accurate as of May 11, 2019 11:59 PM. If you have any questions, ask your nurse or doctor.        STOP taking these medications   KlonoPIN 0.5 MG tablet Generic drug: clonazePAM Stopped by: [name] , MD     TAKE these medications   amphetamine-dextroamphetamine 20 MG tablet Commonly known as: ADDERALL Take 20 mg by mouth daily.   amphetamine-dextroamphetamine 20 MG tablet Commonly known as: ADDERALL Take by mouth.   famotidine 10 MG tablet Commonly known as: PEPCID Take 10 mg by mouth 2 (two) times daily.   naproxen sodium 220 MG tablet Commonly known as: ALEVE Take 220 mg by mouth.   norethindrone-ethinyl estradiol 1-20 MG-MCG tablet Commonly known as: LOESTRIN FE Take 1 tablet by mouth daily.   propranolol 20 MG tablet Commonly known as: INDERAL Take by mouth.   SLEEP AID PO Take by mouth.   traZODone 50 MG tablet Commonly known  as: DESYREL Take by mouth.         REVIEW OF SYSTEMS: A comprehensive ROS was conducted with the patient and is negative except as per HPI and below:  ROS     OBJECTIVE:  VS: BP (!) 148/78 (BP Location: Left Arm, Patient Position: Sitting, Cuff Size: Normal)   Pulse (!) 114   Temp 99.8 F (37.7 C)   Ht 5\' 5"  (1.651 m)   Wt 148 lb 6.4 oz (67.3 kg)   SpO2 98%   BMI 24.70 kg/m    Wt Readings from Last 3 Encounters:  05/11/19 148 lb 6.4 oz (67.3 kg)  04/07/18 142 lb 12.8 oz (64.8 kg)  03/08/16 174 lb (78.9 kg)     EXAM: General: Pt appears well and is in NAD  Hydration: Well-hydrated with moist mucous  membranes and good skin turgor  Eyes: External eye exam normal without stare, lid lag or exophthalmos.  EOM intact.  PERRL.  Ears, Nose, Throat: Hearing: Grossly intact bilaterally Dental: Good dentition  Throat: Clear without mass, erythema or exudate  Neck: General: Supple without adenopathy. Thyroid: Thyroid size normal.  No goiter or nodules appreciated. No thyroid bruit.  Lungs: Clear with good BS bilat with no rales, rhonchi, or wheezes  Heart: Auscultation: RRR.  Abdomen: Normoactive bowel sounds, soft, nontender, without masses or organomegaly palpable  Extremities:  BL LE: No pretibial edema normal ROM and strength.  Skin:  Skin Inspection: facial and neck scarring.  Skin Palpation: Skin temperature, texture, and thickness normal to palpation  Neuro: Cranial nerves: II - XII grossly intact  Cerebellar: Normal coordination and movement; no tremor Motor: Normal strength throughout DTRs: 2+ and symmetric in UE without delay in relaxation phase  Mental Status: Judgment, insight: Intact Orientation: Oriented to time, place, and person Mood and affect: No depression, anxiety, or agitation     DATA REVIEWED: 11/07/2012  FSH 2.1 mIU/mL  LH 4.2 mIU/mL  Total testosterone 39.03 ng/dL  Free testosterone  8.0 pg/mL (reference 0.6-6.8)     Prolactin 10.9 ng/mL DHEAS 229 ug/dL     ASSESSMENT/PLAN/RECOMMENDATIONS:   1. Hx of Elevated Testosterone :   - Pt with symptoms of acne and hirsutism despite being on OCP's for the past 2 years. We will repeat early morning testosterone next week but she understand her levels may be lower then her actual baseline given the fact that she has been on OCP 's for 2 weeks.    2. Hirsutism : - Difficult to perform the ferrima-Gallwey scale as the pt shaved today .  - This is something bothersome to her and will proceed with spironolactone once labs have come back - Discussed side effects of teratogenicity and the importance of being on OCP's  while on spironolactone, we discussed risk of dehydration, elevated K+ and transient elevation in Creatinine in some.     3. Pre-Diabetes :   - Pt was on Metformin at some point but was lost to followup.   - She has been able to lose weight and maintain the weight loss over the past 2 yrs - Praised the pt on lifestyle changed and I have encouraged her to continue      F/u in 6 months   Signed electronically by: Mack Guise, MD  [facility] Endocrinology  [facility] [address] [place], [place] [address] Phone: [phone] FAX: [phone]   CC: Patient, No Pcp Per No address on file Phone: None Fax: None  Return to Endocrinology clinic as below: Future Appointments  Date Time Provider Department Center  05/18/2019  8:15 AM [facility] None  11/16/2019  8:50 AM , Konrad DoloresIbtehal Jaralla, MD [facility] None

## 2019-05-11 NOTE — Patient Instructions (Signed)
-  Please stop by the lab next Friday around 8 AM, please make sure you are fasting that morning

## 2019-05-18 ENCOUNTER — Other Ambulatory Visit: Payer: Self-pay

## 2019-05-18 ENCOUNTER — Other Ambulatory Visit (INDEPENDENT_AMBULATORY_CARE_PROVIDER_SITE_OTHER): Payer: 59

## 2019-05-18 DIAGNOSIS — R7989 Other specified abnormal findings of blood chemistry: Secondary | ICD-10-CM

## 2019-05-18 DIAGNOSIS — L680 Hirsutism: Secondary | ICD-10-CM

## 2019-05-18 DIAGNOSIS — R7303 Prediabetes: Secondary | ICD-10-CM

## 2019-05-18 LAB — BASIC METABOLIC PANEL
BUN: 9 mg/dL (ref 6–23)
CO2: 29 mEq/L (ref 19–32)
Calcium: 9 mg/dL (ref 8.4–10.5)
Chloride: 104 mEq/L (ref 96–112)
Creatinine, Ser: 0.7 mg/dL (ref 0.40–1.20)
GFR: 101.51 mL/min (ref >60.00)
Glucose, Bld: 89 mg/dL (ref 70–99)
Potassium: 3.8 mEq/L (ref 3.5–5.1)
Sodium: 139 mEq/L (ref 135–145)

## 2019-05-18 LAB — HEMOGLOBIN A1C: Hgb A1c MFr Bld: 5.6 % (ref 4.6–6.5)

## 2019-05-21 ENCOUNTER — Other Ambulatory Visit: Payer: Self-pay | Admitting: Physician Assistant

## 2019-05-21 DIAGNOSIS — Z309 Encounter for contraceptive management, unspecified: Secondary | ICD-10-CM

## 2019-05-21 DIAGNOSIS — F329 Major depressive disorder, single episode, unspecified: Secondary | ICD-10-CM | POA: Diagnosis not present

## 2019-05-21 DIAGNOSIS — F902 Attention-deficit hyperactivity disorder, combined type: Secondary | ICD-10-CM | POA: Diagnosis not present

## 2019-05-21 DIAGNOSIS — F411 Generalized anxiety disorder: Secondary | ICD-10-CM | POA: Diagnosis not present

## 2019-05-21 NOTE — Telephone Encounter (Signed)
Requested medications are due for refill today?  No  Requested medications are on the active medication list?  No - Blisovi FE not on medication list   Last refill 02/16/2019  Future visit scheduled?  No  Notes to clinic Patient is not scheduled for follow up.  Blisovi Fe not active on patient's medication list.

## 2019-05-25 ENCOUNTER — Telehealth: Payer: Self-pay | Admitting: Internal Medicine

## 2019-05-25 DIAGNOSIS — R7989 Other specified abnormal findings of blood chemistry: Secondary | ICD-10-CM

## 2019-05-25 DIAGNOSIS — L680 Hirsutism: Secondary | ICD-10-CM

## 2019-05-25 LAB — 17-HYDROXYPROGESTERONE: 17-OH-Progesterone, LC/MS/MS: 31 ng/dL

## 2019-05-25 LAB — TESTOSTERONE, FREE & TOTAL
Free Testosterone: 1.7 pg/mL (ref 0.1–6.4)
Testosterone, Total, LC-MS-MS: 20 ng/dL (ref 2–45)

## 2019-05-25 LAB — FSH/LH
FSH: 6.8 m[IU]/mL
LH: 6.8 m[IU]/mL

## 2019-05-25 LAB — PROLACTIN: Prolactin: 13.1 ng/mL

## 2019-05-25 LAB — DHEA-SULFATE: DHEA-SO4: 122 ug/dL (ref 18–391)

## 2019-05-25 NOTE — Telephone Encounter (Signed)
Left a message for a call back.

## 2019-06-11 NOTE — Telephone Encounter (Signed)
Patient is returning your call.  

## 2019-06-12 MED ORDER — SPIRONOLACTONE 25 MG PO TABS: 25.0000 mg | ORAL_TABLET | Freq: Two times a day (BID) | ORAL | 3 refills | Status: DC

## 2019-06-12 NOTE — Addendum Note (Signed)
Addended by: Dorita Sciara on: 06/12/2019 11:51 AM   Modules accepted: Orders

## 2019-06-12 NOTE — Telephone Encounter (Signed)
Spoke with the pt on 06/12/19 @ 1145 AM About normal lab results which is expected with hormone therapy (pt on loestrin)   Discussed adding spironolactone given excessive hair growth     Pt in agreement.   We discussed the risk of hyperkalemia and dehydration, pt understand a BMP is recommended in 6 weeks.   Pt opted to call back to schedule this.     Melanie Crazier Mohd Clemons

## 2019-07-27 ENCOUNTER — Other Ambulatory Visit: Payer: Self-pay

## 2019-07-27 ENCOUNTER — Other Ambulatory Visit (INDEPENDENT_AMBULATORY_CARE_PROVIDER_SITE_OTHER): Payer: 59

## 2019-07-27 DIAGNOSIS — L680 Hirsutism: Secondary | ICD-10-CM

## 2019-07-27 LAB — BASIC METABOLIC PANEL
BUN: 11 mg/dL (ref 6–23)
CO2: 26 mEq/L (ref 19–32)
Calcium: 9.5 mg/dL (ref 8.4–10.5)
Chloride: 102 mEq/L (ref 96–112)
Creatinine, Ser: 0.68 mg/dL (ref 0.40–1.20)
GFR: 104.8 mL/min (ref >60.00)
Glucose, Bld: 88 mg/dL (ref 70–99)
Potassium: 3.6 mEq/L (ref 3.5–5.1)
Sodium: 136 mEq/L (ref 135–145)

## 2019-08-13 DIAGNOSIS — F329 Major depressive disorder, single episode, unspecified: Secondary | ICD-10-CM | POA: Diagnosis not present

## 2019-08-13 DIAGNOSIS — F902 Attention-deficit hyperactivity disorder, combined type: Secondary | ICD-10-CM | POA: Diagnosis not present

## 2019-08-13 DIAGNOSIS — F411 Generalized anxiety disorder: Secondary | ICD-10-CM | POA: Diagnosis not present

## 2019-09-07 ENCOUNTER — Ambulatory Visit (INDEPENDENT_AMBULATORY_CARE_PROVIDER_SITE_OTHER): Payer: 59

## 2019-09-07 ENCOUNTER — Encounter: Payer: Self-pay | Admitting: Family Medicine

## 2019-09-07 ENCOUNTER — Other Ambulatory Visit: Payer: Self-pay

## 2019-09-07 ENCOUNTER — Ambulatory Visit (INDEPENDENT_AMBULATORY_CARE_PROVIDER_SITE_OTHER): Payer: 59 | Admitting: Family Medicine

## 2019-09-07 VITALS — BP 100/70 | HR 104 | Temp 98.4°F | Ht 65.5 in | Wt 148.4 lb

## 2019-09-07 DIAGNOSIS — Z309 Encounter for contraceptive management, unspecified: Secondary | ICD-10-CM | POA: Diagnosis not present

## 2019-09-07 DIAGNOSIS — M79674 Pain in right toe(s): Secondary | ICD-10-CM | POA: Diagnosis not present

## 2019-09-07 DIAGNOSIS — M79671 Pain in right foot: Secondary | ICD-10-CM | POA: Diagnosis not present

## 2019-09-07 DIAGNOSIS — Z0000 Encounter for general adult medical examination without abnormal findings: Secondary | ICD-10-CM

## 2019-09-07 DIAGNOSIS — R5383 Other fatigue: Secondary | ICD-10-CM | POA: Diagnosis not present

## 2019-09-07 DIAGNOSIS — Z7689 Persons encountering health services in other specified circumstances: Secondary | ICD-10-CM

## 2019-09-07 DIAGNOSIS — G8929 Other chronic pain: Secondary | ICD-10-CM

## 2019-09-07 MED ORDER — NORETHIN ACE-ETH ESTRAD-FE 1-20 MG-MCG PO TABS: 1.0000 | ORAL_TABLET | Freq: Every day | ORAL | 3 refills | Status: DC

## 2019-09-07 NOTE — Progress Notes (Signed)
Brittany Frazier is a 28 y.o. female  Chief Complaint  Patient presents with  . Establish Care  . Annual Exam    Pt does not have a GYN and does not want a PAP today, which she is do for.  Pt was a pt at [facility], which they are no longer there.       HPI: Brittany Frazier is a 28 y.o. female here to establish care with our office and for annual CPE. Previous PCP was at [facility]. Pt is due for PAP but declines today. She does not have a GYN. She works as an Garment/textile technologist at [employer].  Specialists: derm - Dr. Prince Rome; endocrinology - Dr. Kelton Pillar; psych - Dr. Raelyn Number as well as therapist  Last PAP: 04/2018 - due in 04/2019 but has not done, pt will RTO for PAP as she started her period this week Dental exam: due - 10/2018 Vision exam: due   She complains of fatigue. She does work nights. She also notes 4 yr h/o Rt foot with deformity of toes. Also swelling, redness, and pain in toes that occurs 3-4x/wk most weeks. No fever, chills.    Past Medical History:  Diagnosis Date  . GERD (gastroesophageal reflux disease)     Past Surgical History:  Procedure Laterality Date  . HERNIA REPAIR      Social History   Socioeconomic History  . Marital status: Single    Spouse name: Not on file  . Number of children: Not on file  . Years of education: Not on file  . Highest education level: Not on file  Occupational History  . Not on file  Social Needs  . Financial resource strain: Not on file  . Food insecurity    Worry: Not on file    Inability: Not on file  . Transportation needs    Medical: Not on file    Non-medical: Not on file  Tobacco Use  . Smoking status: Current Every Day Smoker  . Smokeless tobacco: Never Used  Substance and Sexual Activity  . Alcohol use: Yes    Alcohol/week: 1.0 standard drinks    Types: 1 Standard drinks or equivalent per week  . Drug use: No  . Sexual activity: Not Currently  Lifestyle  . Physical activity   Days per week: Not on file    Minutes per session: Not on file  . Stress: Not on file  Relationships  . Social Herbalist on phone: Not on file    Gets together: Not on file    Attends religious service: Not on file    Active member of club or organization: Not on file    Attends meetings of clubs or organizations: Not on file    Relationship status: Not on file  . Intimate partner violence    Fear of current or ex partner: Not on file    Emotionally abused: Not on file    Physically abused: Not on file    Forced sexual activity: Not on file  Other Topics Concern  . Not on file  Social History Narrative  . Not on file    Family History  Problem Relation Age of Onset  . Diabetes Paternal Grandfather   . Stroke Paternal Grandfather   . Diabetes Maternal Grandfather   . Heart disease Maternal Grandfather      Immunization History  Administered Date(s) Administered  . Influenza Split 01/19/2013  . Influenza,inj,Quad PF,6+ Mos  07/08/2014  . MMR 01/20/2013  . PPD Test 01/19/2013, 05/07/2013  . Td 01/16/2013  . Tdap 08/04/2007    Outpatient Encounter Medications as of 09/07/2019  Medication Sig Note  . amphetamine-dextroamphetamine (ADDERALL) 20 MG tablet Take 20 mg by mouth daily. 04/07/2018: Per pt taking three times a day  . amphetamine-dextroamphetamine (ADDERALL) 20 MG tablet Take by mouth. 0/07/8118: duplicate  . Doxylamine Succinate, Sleep, (SLEEP AID PO) Take by mouth.   . famotidine (PEPCID) 10 MG tablet Take 10 mg by mouth 2 (two) times daily.   . naproxen sodium (ALEVE) 220 MG tablet Take 220 mg by mouth.   . norethindrone-ethinyl estradiol (JUNEL FE,GILDESS FE,LOESTRIN FE) 1-20 MG-MCG tablet Take 1 tablet by mouth daily.   . propranolol (INDERAL) 20 MG tablet Take by mouth.   . spironolactone (ALDACTONE) 25 MG tablet Take 1 tablet (25 mg total) by mouth 2 (two) times daily.   . [DISCONTINUED] traZODone (DESYREL) 50 MG tablet Take by mouth.    No  facility-administered encounter medications on file as of 09/07/2019.      ROS: Pertinent positives and negatives noted in HPI. Remainder of ROS non-contributory    Allergies  Allergen Reactions  . Sulfa Antibiotics Rash    Pulse (!) 104   Temp 98.4 F (36.9 C) (Oral)   Ht 5' 5.5" (1.664 m)   Wt 148 lb 6.4 oz (67.3 kg)   SpO2 98%   BMI 24.32 kg/m   Physical Exam  Constitutional: She is oriented to person, place, and time. She appears well-developed and well-nourished. No distress.  HENT:  Head: Normocephalic and atraumatic.  Right Ear: Tympanic membrane and ear canal normal.  Left Ear: Tympanic membrane and ear canal normal.  Nose: Nose normal.  Mouth/Throat: Oropharynx is clear and moist and mucous membranes are normal.  Neck: Normal range of motion. Neck supple. No thyromegaly present.  Cardiovascular: Normal rate, regular rhythm, normal heart sounds and intact distal pulses.  Pulmonary/Chest: Effort normal and breath sounds normal. No respiratory distress.  Abdominal: Soft. Bowel sounds are normal. She exhibits no distension and no mass. There is no abdominal tenderness. There is no rebound and no guarding.  Musculoskeletal: Normal range of motion.        General: Deformity (Rt toes with what appears to be mild contracture as well as prominent DIP joints vs ?nodules) present. No tenderness or edema.  Lymphadenopathy:    She has no cervical adenopathy.  Neurological: She is alert and oriented to person, place, and time. She exhibits normal muscle tone. Coordination normal.  Skin: Skin is warm and dry. No pallor.  Psychiatric: She has a normal mood and affect. Her behavior is normal.     A/P:  1. Annual physical exam - due for PAP and will RTO to have this done - flu vaccine and Tdap UTD, recommend HPV vaccine but pt would like to think about it and discuss at PAP appt - discussed importance of regular CV exercise, healthy diet, adequate sleep - Lipid panel - CBC -  next CPE in 1 year  2. Encounter to establish care with new doctor  3. Encounter for contraceptive management, unspecified type Refill: - norethindrone-ethinyl estradiol (LOESTRIN FE) 1-20 MG-MCG tablet; Take 1 tablet by mouth daily.  Dispense: 3 Package; Refill: 3 - RTO in next few weeks for PAP  4. Toe pain, chronic, right - symptoms x 2 years, occurring 3+ times per week - Uric acid - Rheumatoid factor - Sedimentation rate - C-reactive protein -  ANA - DG Foot Complete Right  5. Fatigue, unspecified type - CBC - Iron, TIBC and Ferritin Panel - Vitamin B12

## 2019-09-08 LAB — CBC
Hematocrit: 42.3 % (ref 34.0–46.6)
Hemoglobin: 14.3 g/dL (ref 11.1–15.9)
MCH: 33.3 pg — ABNORMAL HIGH (ref 26.6–33.0)
MCHC: 33.8 g/dL (ref 31.5–35.7)
MCV: 99 fL — ABNORMAL HIGH (ref 79–97)
Platelets: 376 10*3/uL (ref 150–450)
RBC: 4.29 x10E6/uL (ref 3.77–5.28)
RDW: 11.9 % (ref 11.7–15.4)
WBC: 4.9 10*3/uL (ref 3.4–10.8)

## 2019-09-08 LAB — LIPID PANEL
Chol/HDL Ratio: 2.9 ratio (ref 0.0–4.4)
Cholesterol, Total: 178 mg/dL (ref 100–199)
HDL: 62 mg/dL (ref >39)
LDL Chol Calc (NIH): 104 mg/dL — ABNORMAL HIGH (ref 0–99)
Triglycerides: 60 mg/dL (ref 0–149)
VLDL Cholesterol Cal: 12 mg/dL (ref 5–40)

## 2019-09-08 LAB — URIC ACID: Uric Acid: 3.3 mg/dL (ref 2.5–7.1)

## 2019-09-08 LAB — IRON,TIBC AND FERRITIN PANEL
Ferritin: 89 ng/mL (ref 15–150)
Iron Saturation: 22 % (ref 15–55)
Iron: 83 ug/dL (ref 27–159)
Total Iron Binding Capacity: 381 ug/dL (ref 250–450)
UIBC: 298 ug/dL (ref 131–425)

## 2019-09-08 LAB — ANA: Anti Nuclear Antibody (ANA): NEGATIVE

## 2019-09-08 LAB — RHEUMATOID FACTOR: Rheumatoid fact SerPl-aCnc: <10 IU/mL (ref 0.0–13.9)

## 2019-09-08 LAB — C-REACTIVE PROTEIN: CRP: 1 mg/L (ref 0–10)

## 2019-09-08 LAB — VITAMIN B12: Vitamin B-12: 1495 pg/mL — ABNORMAL HIGH (ref 232–1245)

## 2019-09-08 LAB — SEDIMENTATION RATE: Sed Rate: 8 mm/hr (ref 0–32)

## 2019-09-12 ENCOUNTER — Encounter: Payer: Self-pay | Admitting: Family Medicine

## 2019-11-05 DIAGNOSIS — F902 Attention-deficit hyperactivity disorder, combined type: Secondary | ICD-10-CM | POA: Diagnosis not present

## 2019-11-05 DIAGNOSIS — F329 Major depressive disorder, single episode, unspecified: Secondary | ICD-10-CM | POA: Diagnosis not present

## 2019-11-05 DIAGNOSIS — F411 Generalized anxiety disorder: Secondary | ICD-10-CM | POA: Diagnosis not present

## 2019-11-16 ENCOUNTER — Ambulatory Visit: Payer: 59 | Admitting: Internal Medicine

## 2019-11-16 ENCOUNTER — Encounter: Payer: Self-pay | Admitting: Internal Medicine

## 2019-11-16 ENCOUNTER — Other Ambulatory Visit: Payer: Self-pay

## 2019-11-16 VITALS — BP 118/78 | HR 114 | Temp 98.5°F | Ht 65.0 in | Wt 157.8 lb

## 2019-11-16 DIAGNOSIS — R7989 Other specified abnormal findings of blood chemistry: Secondary | ICD-10-CM

## 2019-11-16 DIAGNOSIS — L680 Hirsutism: Secondary | ICD-10-CM

## 2019-11-16 MED ORDER — SPIRONOLACTONE 100 MG PO TABS: 100.0000 mg | ORAL_TABLET | Freq: Two times a day (BID) | ORAL | 7 refills | Status: DC

## 2019-11-16 NOTE — Progress Notes (Signed)
Name: Brittany Frazier  MRN/ DOB: [id number], 01-24-1991    Age/ Sex: 29 y.o., female     PCP: Ronnald Nian, DO   Reason for Endocrinology Evaluation: Hirsutism      Initial Endocrinology Clinic Visit: 05/11/2019    PATIENT IDENTIFIER: Brittany Frazier is a 29 y.o., female with a past medical history of Cystic acne( on isotretinoin), hidradenitis suppurativa (previously on humira), steatocytoma multiplex, and ADD . She has followed with [facility] since 05/11/2019 for consultative assistance with management of her Hirsutism.   HISTORICAL SUMMARY:  Pt was referred by Dr. Prince Rome (Dermatolgoy ) for previously elevated  Testosterone. She has seen Dr. Chalmers Cater in the past.  She was prescribed metformin at the time due to a diagnosis of pre-diabtes but was lost to follow up and did not continue to take it. She lost a lot of weight with lifestyle changes after college.   She has been  treated for severe cystic acne, and steatocytoma multiplex. She was on accutane and spironolactone 50 mg daily for~ 8 months through dermatology.   Menarche at 29 yrs of age with regular period  But with  dysmenorrhea  Has been  on Junel since 2018 Sprironolactone added 05/2019 SUBJECTIVE:   During last visit (05/11/2019): Started spironolactone   Today (11/16/2019):  Brittany Frazier is here for a follow up on cystic acne, hx of elevated testosterone and hirsutism. She has been taking spironolactone without side effects, she has noted minimal change in hair growth if any, the hair growth is mainly over the face area, thick but light color.    ROS:  As per HPI.   HISTORY:  Past Medical History:  Past Medical History:  Diagnosis Date  . GERD (gastroesophageal reflux disease)    Past Surgical History:  Past Surgical History:  Procedure Laterality Date  . HERNIA REPAIR      Social History:  reports that she quit smoking about 2 months ago. She has never used smokeless  tobacco. She reports current alcohol use of about 1.0 standard drinks of alcohol per week. She reports that she does not use drugs. Family History:  Family History  Problem Relation Age of Onset  . Diabetes Paternal Grandfather   . Stroke Paternal Grandfather   . Diabetes Maternal Grandfather   . Heart disease Maternal Grandfather      HOME MEDICATIONS: Allergies as of 11/16/2019      Reactions   Sulfa Antibiotics Rash      Medication List       Accurate as of November 16, 2019  9:35 AM. If you have any questions, ask your nurse or doctor.        amphetamine-dextroamphetamine 20 MG tablet Commonly known as: ADDERALL Take 20 mg by mouth daily.   amphetamine-dextroamphetamine 20 MG tablet Commonly known as: ADDERALL Take by mouth.   famotidine 10 MG tablet Commonly known as: PEPCID Take 10 mg by mouth 2 (two) times daily.   naproxen sodium 220 MG tablet Commonly known as: ALEVE Take 220 mg by mouth.   norethindrone-ethinyl estradiol 1-20 MG-MCG tablet Commonly known as: LOESTRIN FE Take 1 tablet by mouth daily.   propranolol 20 MG tablet Commonly known as: INDERAL Take by mouth.   SLEEP AID PO Take by mouth.   spironolactone 100 MG tablet Commonly known as: ALDACTONE Take 1 tablet (100 mg total) by mouth 2 (two) times daily. What changed:   medication strength  how much to take  when to take this Changed by: Scarlette Shorts, MD         OBJECTIVE:   PHYSICAL EXAM: VS: BP 118/78 (BP Location: Left Arm, Patient Position: Sitting, Cuff Size: Normal)   Pulse (!) 114   Temp 98.5 F (36.9 C)   Ht 5\' 5"  (1.651 m)   Wt 157 lb 12.8 oz (71.6 kg)   SpO2 99%   BMI 26.26 kg/m    EXAM: General: Pt appears well and is in NAD  Neck: General: Supple without adenopathy. Thyroid: Thyroid size normal.  No goiter or nodules appreciated. No thyroid bruit.  Lungs: Clear with good BS bilat with no rales, rhonchi, or wheezes  Heart: Auscultation: RRR.   Abdomen: Normoactive bowel sounds, soft, nontender, without masses or organomegaly palpable  Extremities:  BL LE: No pretibial edema normal ROM and strength.  Skin: Hair: Texture and amount normal with gender appropriate distribution Skin Inspection: facial and neck scarring  Skin Palpation: Skin temperature, texture, and thickness normal to palpation  Neuro: Cranial nerves: II - XII grossly intact  Motor: Normal strength throughout DTRs: 2+ and symmetric in UE without delay in relaxation phase  Mental Status: Judgment, insight: Intact Orientation: Oriented to time, place, and person Mood and affect: No depression, anxiety, or agitation     DATA REVIEWED: Results for CERDAS MAYRENE, BASTARACHE (MRN Ival Bible) as of 11/16/2019 09:36  Ref. Range 07/27/2019 07:59  Sodium Latest Ref Range: 135 - 145 mEq/L 136  Potassium Latest Ref Range: 3.5 - 5.1 mEq/L 3.6  Chloride Latest Ref Range: 96 - 112 mEq/L 102  CO2 Latest Ref Range: 19 - 32 mEq/L 26  Glucose Latest Ref Range: 70 - 99 mg/dL 88  BUN Latest Ref Range: 6 - 23 mg/dL 11  Creatinine Latest Ref Range: 0.40 - 1.20 mg/dL 07/29/2019  Calcium Latest Ref Range: 8.4 - 10.5 mg/dL 9.5         7.42  FSH 2.1 mIU/mL  LH 4.2 mIU/mL  Total testosterone 39.03 ng/dL  Free testosterone  8.0 pg/mL (reference 0.6-6.8)     Prolactin 10.9 ng/mL DHEAS 229 ug/dL    ASSESSMENT / PLAN / RECOMMENDATIONS:   1. Hirsutism/ Hx of Elevated Testosterone :   - Pt with symptoms of acne and hirsutism despite being on OCP's since 2018. We added Spironolactone in 7/  2020 - Discussed side effects of teratogenicity and the importance of being on OCP's while on spironolactone, we discussed risk of dehydration, elevated K+ and transient elevation in Creatinine in some.  - Will increase spironolactone , as minimal improvement noted , she was instructed to start with 1 tablet and gradually increase it to 2 tabs   Medications  Increase spironolactone to 100 mg  BID  Continue Junel    Labs in 8 weeks    F/U in 7 months    Signed electronically by: 2021, MD  [facility] Endocrinology  [facility] [address] [place], [place] Phone: [phone] FAX: [phone]      CC: [phone], DO [address] [place] Phone: [phone]  Fax: [phone]   Return to Endocrinology clinic as below: Future Appointments  Date Time Provider Department Center  01/11/2020  8:00 AM [facility] None  06/20/2020  7:30 AM , 06/22/2020, MD [facility] None

## 2019-11-16 NOTE — Patient Instructions (Signed)
-   Please increase Spironolactone to 100 mg, start with daily and try and increase to 2 tablet daily after a few weeks on the one tablet.    - Labs in 8 weeks

## 2019-12-03 DIAGNOSIS — F902 Attention-deficit hyperactivity disorder, combined type: Secondary | ICD-10-CM | POA: Diagnosis not present

## 2019-12-03 DIAGNOSIS — F411 Generalized anxiety disorder: Secondary | ICD-10-CM | POA: Diagnosis not present

## 2019-12-03 DIAGNOSIS — F329 Major depressive disorder, single episode, unspecified: Secondary | ICD-10-CM | POA: Diagnosis not present

## 2020-01-11 ENCOUNTER — Other Ambulatory Visit: Payer: Self-pay

## 2020-01-11 ENCOUNTER — Other Ambulatory Visit (INDEPENDENT_AMBULATORY_CARE_PROVIDER_SITE_OTHER): Payer: 59

## 2020-01-11 DIAGNOSIS — L680 Hirsutism: Secondary | ICD-10-CM | POA: Diagnosis not present

## 2020-01-11 LAB — BASIC METABOLIC PANEL
BUN: 11 mg/dL (ref 6–23)
CO2: 26 mEq/L (ref 19–32)
Calcium: 9 mg/dL (ref 8.4–10.5)
Chloride: 100 mEq/L (ref 96–112)
Creatinine, Ser: 0.71 mg/dL (ref 0.40–1.20)
GFR: 99.35 mL/min (ref >60.00)
Glucose, Bld: 94 mg/dL (ref 70–99)
Potassium: 3.9 mEq/L (ref 3.5–5.1)
Sodium: 132 mEq/L — ABNORMAL LOW (ref 135–145)

## 2020-02-19 DIAGNOSIS — F339 Major depressive disorder, recurrent, unspecified: Secondary | ICD-10-CM | POA: Diagnosis not present

## 2020-02-19 DIAGNOSIS — F902 Attention-deficit hyperactivity disorder, combined type: Secondary | ICD-10-CM | POA: Diagnosis not present

## 2020-02-19 DIAGNOSIS — F411 Generalized anxiety disorder: Secondary | ICD-10-CM | POA: Diagnosis not present

## 2020-03-28 IMAGING — DX DG FOOT COMPLETE 3+V*R*
3 series · 3 of 3 positions shown · non-contrast
Comparison: None.

CLINICAL DATA: Right foot deformity and pain

EXAM:
RIGHT FOOT COMPLETE - 3+ VIEW

[foot ap]
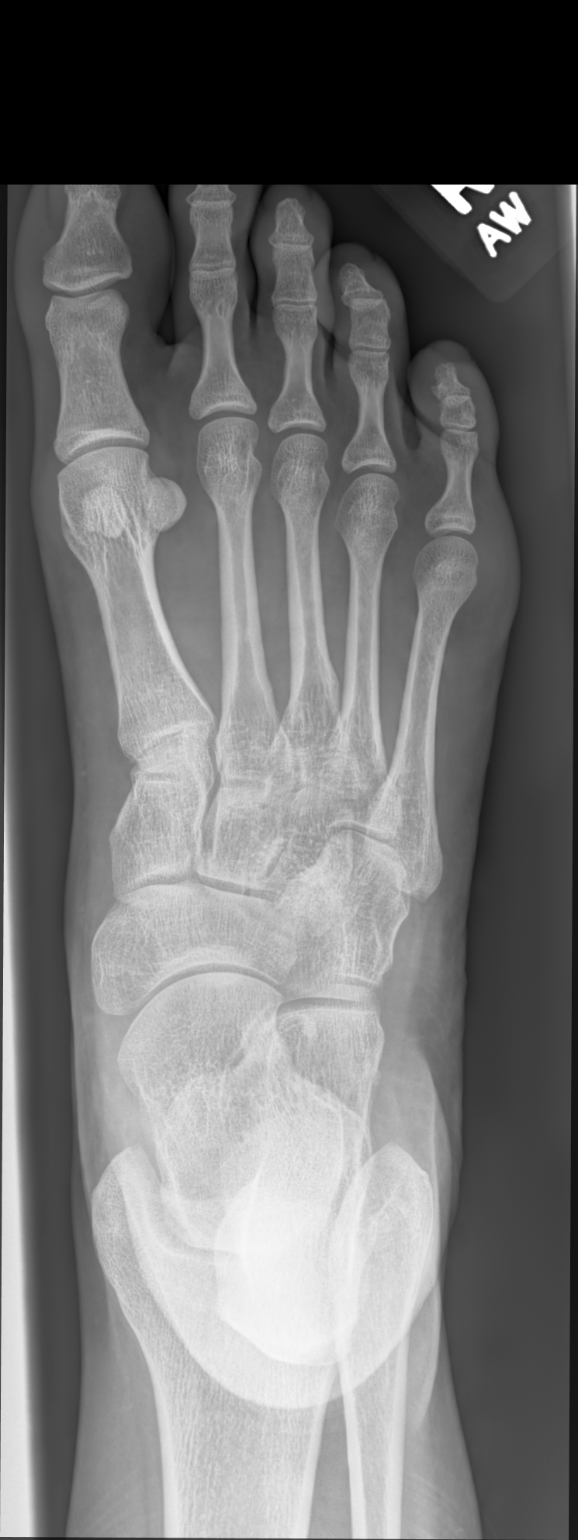

[foot mlo]
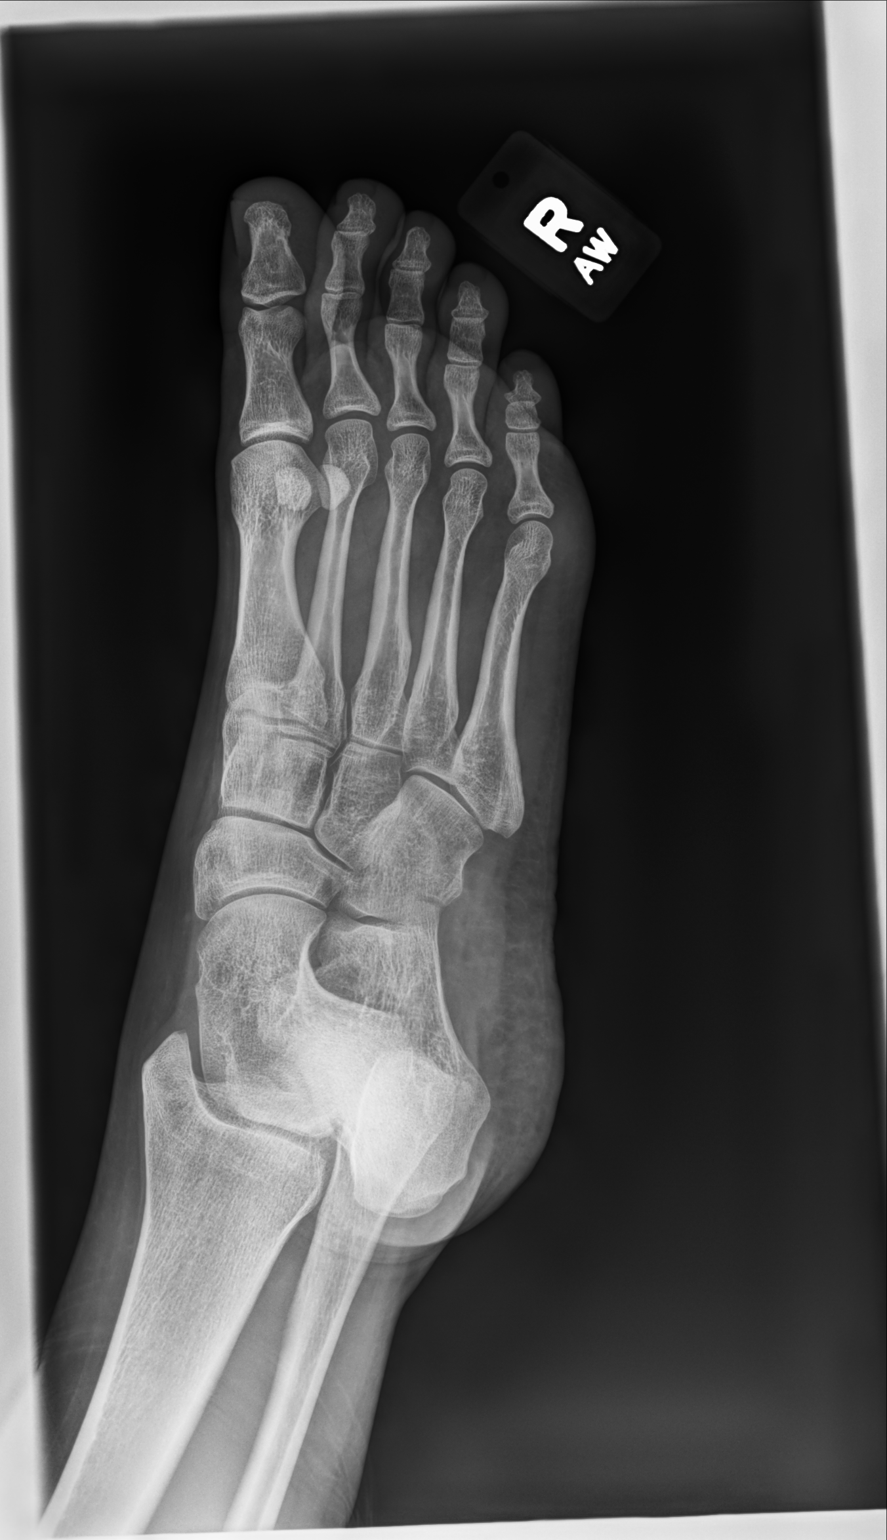

[foot lat]
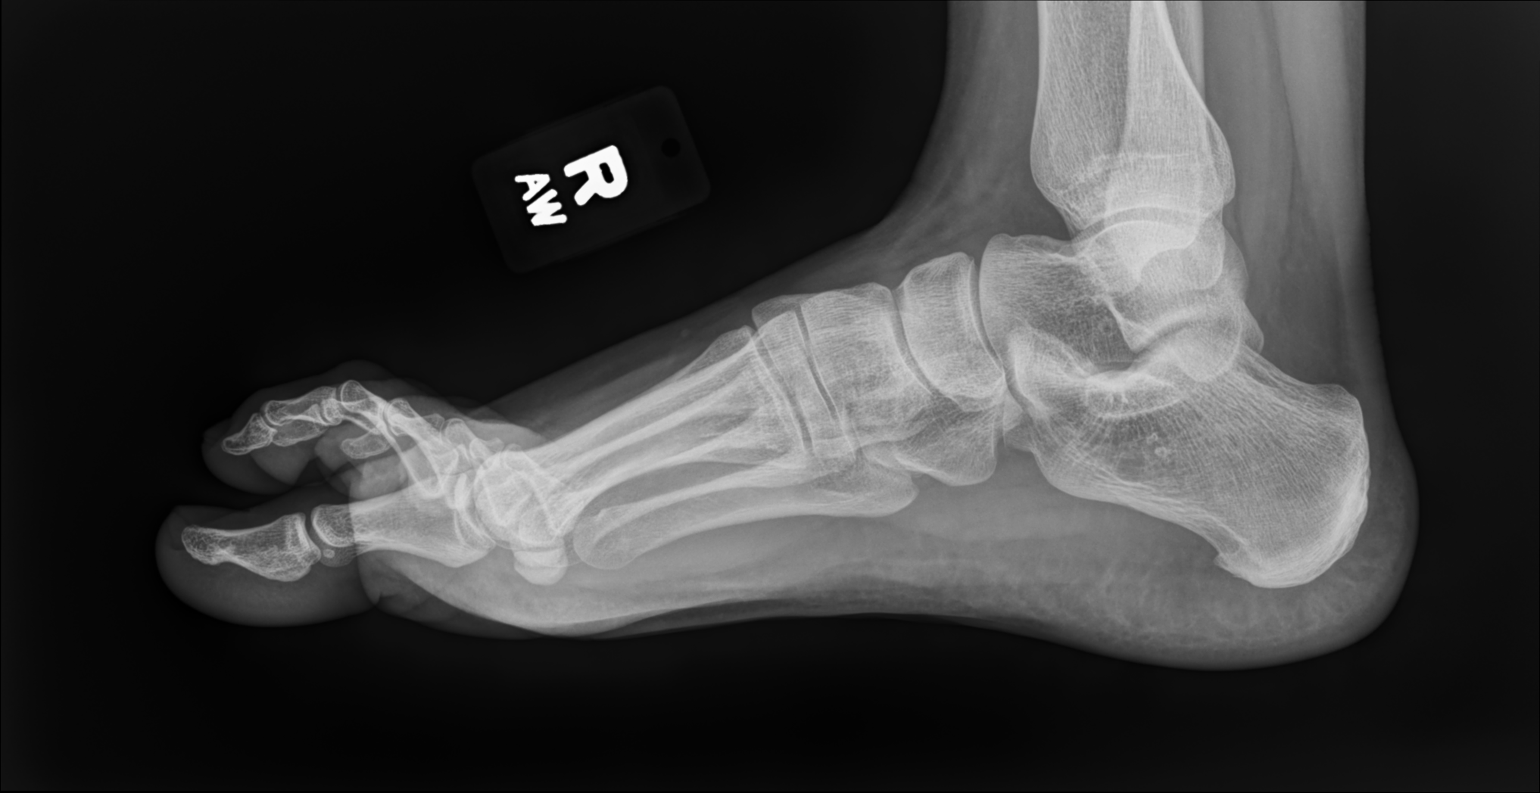

[3 of 3 positions shown; findings below may reference images not displayed]

FINDINGS: There is no evidence of fracture or dislocation. There is no
evidence of arthropathy or other focal bone abnormality. Soft
tissues are unremarkable. Plantar calcaneal spur
IMPRESSION: Negative.

## 2020-05-12 DIAGNOSIS — F902 Attention-deficit hyperactivity disorder, combined type: Secondary | ICD-10-CM | POA: Diagnosis not present

## 2020-05-12 DIAGNOSIS — F339 Major depressive disorder, recurrent, unspecified: Secondary | ICD-10-CM | POA: Diagnosis not present

## 2020-05-12 DIAGNOSIS — F411 Generalized anxiety disorder: Secondary | ICD-10-CM | POA: Diagnosis not present

## 2020-05-21 ENCOUNTER — Other Ambulatory Visit: Payer: Self-pay

## 2020-05-21 ENCOUNTER — Encounter: Payer: Self-pay | Admitting: Dermatology

## 2020-05-21 ENCOUNTER — Ambulatory Visit: Payer: 59 | Admitting: Dermatology

## 2020-05-21 DIAGNOSIS — L722 Steatocystoma multiplex: Secondary | ICD-10-CM | POA: Diagnosis not present

## 2020-05-21 DIAGNOSIS — L219 Seborrheic dermatitis, unspecified: Secondary | ICD-10-CM | POA: Diagnosis not present

## 2020-05-21 DIAGNOSIS — L700 Acne vulgaris: Secondary | ICD-10-CM

## 2020-05-21 MED ORDER — WINLEVI 1 % EX CREA: 1.0000 "application " | TOPICAL_CREAM | Freq: Once | CUTANEOUS | 5 refills | Status: AC

## 2020-05-21 MED ORDER — CLOBETASOL PROPIONATE 0.05 % EX FOAM: Freq: Two times a day (BID) | CUTANEOUS | 0 refills | Status: DC

## 2020-05-21 NOTE — Patient Instructions (Addendum)
First follow-up in years for Brittany Frazier. She has tried every standard and some nonstandard therapies for her scarring facial cystic acne without excellent success; this includes multiple courses of isotretinoin. She is currently on 100 mg twice daily of spironolactone with improvement in her scalp hair density, somewhat less facial hair, but really no effect on her facial acne. The cystic lesions on her neck clinically fit steatocystoma which is not part of the acne spectrum, but no confirmatory biopsy either has been obtained or is planned. We discussed available options and mutually decided if available and affordable to go with the brand-new topical antiandrogen called Winlevi or clascoterone. This prescription was phoned in and Brittany Frazier will initially use this nightly and call me in 6 weeks with an update report. Her scalp which does have some scale and inflammation she will do 1 month of topical clobetasol foam; if this is not a covered product she will have the pharmacist call me and we will substitute. Routine recheck in 3 months

## 2020-05-22 ENCOUNTER — Telehealth: Payer: Self-pay

## 2020-05-22 NOTE — Telephone Encounter (Signed)
Phone call to patient's Pharmacy to see why they can't get the medication or if they have to wait and order the medication?  Per Darl Pikes @ [facility] their wholesaler doesn't have the medication and when they went to look at [employer] the medication show's that it's discontinued.  Dr. Jorja Loa aware.

## 2020-05-22 NOTE — Telephone Encounter (Signed)
Fax received from patient's Pharmacy stating they can not get the Winlevi 1% Cream that was prescribed by Dr. Jorja Loa.

## 2020-05-23 NOTE — Telephone Encounter (Signed)
Informed patient research would be done to get the medication or alternatives.

## 2020-05-23 NOTE — Telephone Encounter (Signed)
Let pt know that we will try to contact the pharmaceutical manufacturer for accurate information about the availability of this medication. Please try to get this information via google search Brittany Frazier or clascoterone) and let patient/me know result. If it's unavailable, DrT will search for an alternative plan B.

## 2020-05-30 ENCOUNTER — Encounter: Payer: Self-pay | Admitting: Dermatology

## 2020-06-09 ENCOUNTER — Telehealth: Payer: Self-pay | Admitting: Dermatology

## 2020-06-09 MED ORDER — TRETINOIN 0.025 % EX CREA: TOPICAL_CREAM | Freq: Every evening | CUTANEOUS | 0 refills | Status: DC

## 2020-06-09 NOTE — Telephone Encounter (Signed)
Patient is calling to say that she still ha not heard anything from [place] or her pharmacy as far as a new prescription from Janalyn Harder, MD (ST).  Patient says that she talked with ST through My Chart and he told her that a new prescription would be called in.  It would be for a topical Tretnoin cream not Selinda Eon because her insurance will not cover that one.She uses [facility].

## 2020-06-10 NOTE — Telephone Encounter (Signed)
Brittany Frazier ([name]) [phone] Tretinoin 0.025% cream     Status: Sent to Plan  Created: August 9th, 2021 [phone]  Sent: August 10th, 2021  PA STARTED

## 2020-06-11 ENCOUNTER — Telehealth: Payer: Self-pay | Admitting: Dermatology

## 2020-06-11 NOTE — Telephone Encounter (Signed)
Patient said Rx was sent to wrong pharmacy: to [facility] when it should have been [facility]. When she went to pick it up at [facility] she was told that the insurance company says there is "an age issue" so they wouldn't cover it, and she didn't get it. Med was Tretenoin cream. Wants to know what next step is.Marland KitchenMarland KitchenMarland Kitchen

## 2020-06-12 NOTE — Telephone Encounter (Signed)
Pre auth in process [facility]. $117retail Save 46% The price after coupon is$62.48. with free coupon   Brittany Frazier (Key: [id number]) [phone] Need help? Call us at [phone] Status Sent to Planon August 10 Next Steps The plan will fax you a determination, typically within 1 to 5 business days.  How do I follow up? Drug Tretinoin 0.025% cream Form MedImpact Medication Request Form MedImpact Medication Request Form [phone] [phone] Original Claim Info 60

## 2020-06-17 ENCOUNTER — Other Ambulatory Visit: Payer: Self-pay | Admitting: Family Medicine

## 2020-06-17 DIAGNOSIS — Z309 Encounter for contraceptive management, unspecified: Secondary | ICD-10-CM

## 2020-06-20 ENCOUNTER — Encounter: Payer: Self-pay | Admitting: Internal Medicine

## 2020-06-20 ENCOUNTER — Other Ambulatory Visit: Payer: Self-pay

## 2020-06-20 ENCOUNTER — Ambulatory Visit: Payer: 59 | Admitting: Internal Medicine

## 2020-06-20 VITALS — BP 130/90 | HR 112 | Ht 65.0 in | Wt 162.2 lb

## 2020-06-20 DIAGNOSIS — L680 Hirsutism: Secondary | ICD-10-CM

## 2020-06-20 LAB — COMPREHENSIVE METABOLIC PANEL
ALT: 21 U/L (ref 0–35)
AST: 25 U/L (ref 0–37)
Albumin: 4.2 g/dL (ref 3.5–5.2)
Alkaline Phosphatase: 52 U/L (ref 39–117)
BUN: 8 mg/dL (ref 6–23)
CO2: 28 mEq/L (ref 19–32)
Calcium: 9.4 mg/dL (ref 8.4–10.5)
Chloride: 101 mEq/L (ref 96–112)
Creatinine, Ser: 0.71 mg/dL (ref 0.40–1.20)
GFR: 99.01 mL/min (ref >60.00)
Glucose, Bld: 93 mg/dL (ref 70–99)
Potassium: 3.6 mEq/L (ref 3.5–5.1)
Sodium: 136 mEq/L (ref 135–145)
Total Bilirubin: 0.3 mg/dL (ref 0.2–1.2)
Total Protein: 7.1 g/dL (ref 6.0–8.3)

## 2020-06-20 MED ORDER — SPIRONOLACTONE 100 MG PO TABS: 100.0000 mg | ORAL_TABLET | Freq: Two times a day (BID) | ORAL | 3 refills | Status: DC

## 2020-06-20 NOTE — Progress Notes (Signed)
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 She has followed with [facility] since 05/11/2019 for consultative assistance with management of her Hirsutism.   HISTORICAL SUMMARY:  Pt was referred by Dr. Sindy Messing (Dermatolgoy ) for previously elevated  Testosterone. She has seen Dr. Talmage Nap in the past.  She was prescribed metformin at the time due to a diagnosis of pre-diabtes but was lost to follow up and did not continue to take it. She lost a lot of weight with lifestyle changes after college.   She has been  treated for severe cystic acne, and steatocytoma multiplex. She was on accutane and spironolactone 50 mg daily for~ 8 months through dermatology.   Menarche at 29 yrs of age with regular period  But with  dysmenorrhea  Has been  on Junel since 2018 Sprironolactone added 05/2019 SUBJECTIVE:    Today (06/20/2020):  Brittany Frazier is here for a follow up on cystic acne, hx of elevated testosterone and hirsutism. She has been taking spironolactone without side effects, she has noted minimal change in hair growth if any, the hair growth is mainly over the face area, thick but light color.    She has no side effects with spironolactone , no improvement  In acne, But has noted thickening in scalp hair  Will be started oin topical trenoin    ROS:  As per HPI.   HISTORY:  Past Medical History:  Past Medical History:  Diagnosis Date  . GERD (gastroesophageal reflux disease)    Past Surgical History:  Past Surgical History:  Procedure Laterality Date  . HERNIA REPAIR       Social History:  reports that she quit smoking about 9 months ago. She has never used smokeless tobacco. She reports current alcohol use of about 1.0 standard drink of alcohol per week. She reports that she does not use drugs. Family History:  Family History  Problem Relation Age of Onset  . Diabetes Paternal Grandfather   . Stroke Paternal Grandfather   . Diabetes Maternal Grandfather   . Heart disease Maternal Grandfather      HOME MEDICATIONS: Allergies as of 06/20/2020      Reactions   Sulfa Antibiotics Rash      Medication List       Accurate as of June 20, 2020  1:15 PM. If you have any questions, ask your nurse or doctor.        amphetamine-dextroamphetamine 20 MG tablet Commonly known as: ADDERALL Take 20 mg by mouth daily.   cetirizine 10 MG tablet Commonly known as: ZYRTEC Take 10 mg by mouth daily.   clobetasol 0.05 % topical foam Commonly known as: OLUX Apply topically 2 (two) times daily.   norethindrone-ethinyl estradiol 1-20 MG-MCG tablet Commonly known as: LOESTRIN FE Take 1 tablet by mouth daily.   spironolactone 100 MG tablet Commonly known as: ALDACTONE Take 1 tablet (100 mg total) by mouth 2 (two) times daily.   tretinoin 0.025 % cream Commonly known as: RETIN-A Apply topically at bedtime.         OBJECTIVE:  PHYSICAL EXAM: VS: BP 130/90 (BP Location: Left Arm, Patient Position: Sitting, Cuff Size: Normal)   Pulse (!) 112   Ht 5\' 5"  (1.651 m)   Wt 162 lb 3.2 oz (73.6 kg)   SpO2 98%   BMI 26.99 kg/m    EXAM: General: Pt appears well and is in NAD  Neck: General: Supple without adenopathy. Thyroid: Thyroid size normal.  No goiter or nodules appreciated.  Lungs: Clear with good BS bilat with no rales, rhonchi, or wheezes  Heart: Auscultation: RRR.  Abdomen: Normoactive bowel sounds, soft, nontender, without masses or organomegaly palpable  Extremities:  BL LE: No pretibial edema normal ROM and strength.  Skin: Hair:  Texture and amount normal with gender appropriate distribution Skin Inspection: facial and neck scarring  Skin Palpation: Skin temperature, texture, and thickness normal to palpation  Mental Status: Judgment, insight: Intact Orientation: Oriented to time, place, and person Mood and affect: No depression, anxiety, or agitation     DATA REVIEWED: Results for CERDAS KANDA, DELUNA (MRN [place]) as of 06/20/2020 13:16  Ref. Range 06/20/2020 08:07  Sodium Latest Ref Range: 135 - 145 mEq/L 136  Potassium Latest Ref Range: 3.5 - 5.1 mEq/L 3.6  Chloride Latest Ref Range: 96 - 112 mEq/L 101  CO2 Latest Ref Range: 19 - 32 mEq/L 28  Glucose Latest Ref Range: 70 - 99 mg/dL 93  BUN Latest Ref Range: 6 - 23 mg/dL 8  Creatinine Latest Ref Range: 0.40 - 1.20 mg/dL 06/22/2020  Calcium Latest Ref Range: 8.4 - 10.5 mg/dL 9.4  Alkaline Phosphatase Latest Ref Range: 39 - 117 U/L 52  Albumin Latest Ref Range: 3.5 - 5.2 g/dL 4.2  AST Latest Ref Range: 0 - 37 U/L 25  ALT Latest Ref Range: 0 - 35 U/L 21  Total Protein Latest Ref Range: 6.0 - 8.3 g/dL 7.1  Total Bilirubin Latest Ref Range: 0.2 - 1.2 mg/dL 0.3  GFR Latest Ref Range: >60.00 mL/min 99.01          11/07/2012  FSH 2.1 mIU/mL  LH 4.2 mIU/mL  Total testosterone 39.03 ng/dL  Free testosterone  8.0 pg/mL (reference 0.6-6.8)     Prolactin 10.9 ng/mL DHEAS 229 ug/dL    ASSESSMENT / PLAN / RECOMMENDATIONS:   1. Hirsutism/ Hx of Elevated Testosterone :   - Pt with symptoms of acne and hirsutism despite being on OCP's since 2018. We added Spironolactone in 7/ 2020 - Discussed side effects of teratogenicity and the importance of being on OCP's while on spironolactone - Hirsutism has resolved but she continues to follow up with dermatology for acne  Medications  Continue  spironolactone 100 mg BID  Continue Junel      F/U in 1 yr    Signed electronically by: 2021, MD  [facility]  [facility] [address] [place] [place], [place] Phone: [phone] FAX: [phone]      CC: [phone], DO [address] [place] Phone: [phone]  Fax: [phone]   Return to Endocrinology clinic as below: Future Appointments  Date Time Provider Department Center  08/18/2020  3:30 PM 08/20/2020, MD [facility]  06/26/2021  7:30 AM Shalina Norfolk, 06/28/2021, MD [facility] None

## 2020-06-20 NOTE — Patient Instructions (Signed)
-   Continue  Spironolactone 100 mg,twice daily

## 2020-06-24 ENCOUNTER — Other Ambulatory Visit: Payer: Self-pay | Admitting: Family Medicine

## 2020-06-24 NOTE — Telephone Encounter (Signed)
Medication refill request approved and submitted to pharmacy per standing orders.  

## 2020-06-25 ENCOUNTER — Encounter: Payer: Self-pay | Admitting: Dermatology

## 2020-06-25 NOTE — Progress Notes (Signed)
   Follow-Up Visit   Subjective  Brittany Frazier Brittany Frazier is a 30 y.o. female who presents for the following: Skin Problem (scalp issues, redness, flakey & scale).  Acne Location: Face Duration:  Quality:  Associated Signs/Symptoms: Modifying Factors: Several courses of isotretinoin, never with complete clearing or remission. Severity:  Timing: Context: Also concerned with itchy scale on scalp  Objective  Well appearing patient in no apparent distress; mood and affect are within normal limits.  Focused examination including scalp, ears, face, neck, upper torso.   Assessment & Plan    Acne vulgaris (2) Left Buccal Cheek ; Right Buccal Cheek   Winlevi 1-2 times daily if available and affordable.  Steatocystoma multiplex (2) Left Anterior Neck; Right Anterior Neck  No intervention planned if stable.  Should Diane wish, we can refer her to a plastic surgeon to discuss surgery.  Seborrheic dermatitis Mid Occipital Scalp  Topical clobetasol daily for one month     I, Janalyn Harder, MD, have reviewed all documentation for this visit.  The documentation on 06/25/20 for the exam, diagnosis, procedures, and orders are all accurate and complete.

## 2020-07-02 ENCOUNTER — Telehealth: Payer: Self-pay | Admitting: Dermatology

## 2020-07-02 NOTE — Telephone Encounter (Signed)
[facility] pharmacy and tretinoin 0.025% cream for acne.Patient aware cash pay less than 60$. Pa restarted today for medication

## 2020-07-02 NOTE — Telephone Encounter (Signed)
Patient calling to let us know that script was sent to the wrong pharmacy. She has it transferred to the correct pharmacy but they are unable to fill it and states that it still needs a PA.

## 2020-08-04 DIAGNOSIS — F411 Generalized anxiety disorder: Secondary | ICD-10-CM | POA: Diagnosis not present

## 2020-08-04 DIAGNOSIS — F902 Attention-deficit hyperactivity disorder, combined type: Secondary | ICD-10-CM | POA: Diagnosis not present

## 2020-08-04 DIAGNOSIS — F339 Major depressive disorder, recurrent, unspecified: Secondary | ICD-10-CM | POA: Diagnosis not present

## 2020-08-15 ENCOUNTER — Other Ambulatory Visit: Payer: Self-pay | Admitting: Dermatology

## 2020-08-18 ENCOUNTER — Ambulatory Visit: Payer: 59 | Admitting: Dermatology

## 2020-08-18 ENCOUNTER — Other Ambulatory Visit: Payer: Self-pay | Admitting: Dermatology

## 2020-08-25 ENCOUNTER — Other Ambulatory Visit: Payer: Self-pay | Admitting: Physician Assistant

## 2020-08-25 ENCOUNTER — Other Ambulatory Visit: Payer: Self-pay

## 2020-08-25 ENCOUNTER — Ambulatory Visit
Admission: RE | Admit: 2020-08-25 | Discharge: 2020-08-25 | Disposition: A | Discharge status: Home / Self Care | Payer: 59 | Source: Ambulatory Visit

## 2020-08-25 VITALS — BP 124/72 | HR 120 | Temp 98.7°F | Resp 18

## 2020-08-25 DIAGNOSIS — H6983 Other specified disorders of Eustachian tube, bilateral: Secondary | ICD-10-CM | POA: Diagnosis not present

## 2020-08-25 DIAGNOSIS — H9203 Otalgia, bilateral: Secondary | ICD-10-CM

## 2020-08-25 MED ORDER — FLUTICASONE PROPIONATE 50 MCG/ACT NA SUSP
2.0000 | Freq: Every day | NASAL | 0 refills | Status: DC
Start: 2020-08-25 — End: 2020-08-25

## 2020-08-25 MED ORDER — AZELASTINE HCL 0.1 % NA SOLN
2.0000 | Freq: Two times a day (BID) | NASAL | 0 refills | Status: DC
Start: 2020-08-25 — End: 2021-05-15

## 2020-08-25 NOTE — ED Triage Notes (Signed)
Pt c/o bilateral ear pain/fullness for over 2 months with using OTC ear drops. States pain has worsen with drainage in the past 2 wks with some dizziness.

## 2020-08-25 NOTE — ED Provider Notes (Signed)
[facility]    CSN: [id number] Arrival date & time: 08/25/20  1348      History   Chief Complaint Chief Complaint  Patient presents with   appt 2-ear pain    HPI Brittany Frazier is a 29 y.o. female.   29 year old female comes in for bilateral ear pain x 2 months. Has muffled hearing as well. 2 weeks ago, symptoms worsened, and feels that there is drainage in the ear. Had some dizziness that resolved. States has rhinorrhea, nasal congestion for the past 2 months. Does have baseline allergic rhinitis. Denies fever.      Past Medical History:  Diagnosis Date   GERD (gastroesophageal reflux disease)     Patient Active Problem List   Diagnosis Date Noted   Elevated testosterone level 11/16/2019   Hirsuties 11/16/2019   Immune to hepatitis B 05/09/2013   Immune to varicella 05/09/2013   Immune to measles 05/09/2013   Immune to mumps 05/09/2013   Immune to rubella 05/09/2013    Past Surgical History:  Procedure Laterality Date   HERNIA REPAIR      OB History   No obstetric history on file.      Home Medications    Prior to Admission medications   Medication Sig Start Date End Date Taking? Authorizing Provider  fexofenadine (ALLEGRA) 60 MG tablet Take 60 mg by mouth 2 (two) times daily.   Yes [provider]  amphetamine-dextroamphetamine (ADDERALL) 20 MG tablet Take 20 mg by mouth daily.    [provider]  azelastine (ASTELIN) 0.1 % nasal spray Place 2 sprays into both nostrils 2 (two) times daily. 08/25/20   Cathie Hoops, Dannie Woolen V, PA-C  BLISOVI FE 1/20 1-20 MG-MCG tablet TAKE 1 TABLET BY MOUTH DAILY. 06/24/20   Cirigliano, Mary K, DO  clobetasol (OLUX) 0.05 % topical foam APPLY TO THE AFFECTED AREA(S) TOPICALLY TWO TIMES DAILY 08/18/20   Janalyn Harder, MD  fluticasone (FLONASE) 50 MCG/ACT nasal spray Place 2 sprays into both nostrils daily. 08/25/20   Belinda Fisher, PA-C  spironolactone (ALDACTONE) 100 MG tablet Take 1 tablet (100  mg total) by mouth 2 (two) times daily. 06/20/20   Shamleffer, Konrad Dolores, MD  tretinoin (RETIN-A) 0.025 % cream Apply topically at bedtime. 06/09/20 06/09/21  Janalyn Harder, MD    Family History Family History  Problem Relation Age of Onset   Diabetes Paternal Grandfather    Stroke Paternal Grandfather    Diabetes Maternal Grandfather    Heart disease Maternal Grandfather     Social History Social History   Tobacco Use   Smoking status: Former Smoker    Quit date: 09/16/2019    Years since quitting: 0.9   Smokeless tobacco: Never Used  Substance Use Topics   Alcohol use: Yes    Alcohol/week: 1.0 standard drink    Types: 1 Standard drinks or equivalent per week   Drug use: No     Allergies   Sulfa antibiotics   Review of Systems Review of Systems  Reason unable to perform ROS: See HPI as above.     Physical Exam Triage Vital Signs ED Triage Vitals  Enc Vitals Group     BP 08/25/20 1422 124/72     Pulse Rate 08/25/20 1422 (!) 120     Resp 08/25/20 1422 18     Temp 08/25/20 1422 98.7 F (37.1 C)     Temp Source 08/25/20 1422 Oral     SpO2 08/25/20 1422 99 %  Weight --      Height --      Head Circumference --      Peak Flow --      Pain Score 08/25/20 1438 0     Pain Loc --      Pain Edu? --      Excl. in GC? --    No data found.  Updated Vital Signs BP 124/72 (BP Location: Right Arm)    Pulse (!) 120    Temp 98.7 F (37.1 C) (Oral)    Resp 18    LMP 08/11/2020    SpO2 99%   Physical Exam Constitutional:      General: She is not in acute distress.    Appearance: Normal appearance. She is well-developed. She is not toxic-appearing or diaphoretic.  HENT:     Head: Normocephalic and atraumatic.     Ears:     Comments: No tenderness to palpation of bilateral tragus. Ear canal without swelling or erythema. Does seem to be dry with some flaking of skin. No drainage noted. Bilateral TM opaque, mid ear effusion without bulging, erythema.      Mouth/Throat:     Mouth: Mucous membranes are moist.     Pharynx: Oropharynx is clear. Uvula midline.  Eyes:     Conjunctiva/sclera: Conjunctivae normal.     Pupils: Pupils are equal, round, and reactive to light.  Pulmonary:     Effort: Pulmonary effort is normal. No respiratory distress.  Musculoskeletal:     Cervical back: Normal range of motion and neck supple.  Skin:    General: Skin is warm and dry.  Neurological:     Mental Status: She is alert and oriented to person, place, and time.      UC Treatments / Results  Labs (all labs ordered are listed, but only abnormal results are displayed) Labs Reviewed - No data to display  EKG   Radiology No results found.  Procedures Procedures (including critical care time)  Medications Ordered in UC Medications - No data to display  Initial Impression / Assessment and Plan / UC Course  I have reviewed the triage vital signs and the nursing notes.  Pertinent labs & imaging results that were available during my care of the patient were reviewed by me and considered in my medical decision making (see chart for details).    Flonase and azelastine for eustachian tube dysfunction.  Avoid cotton swab use for now.  Return precautions given.  Final Clinical Impressions(s) / UC Diagnoses   Final diagnoses:  Dysfunction of both eustachian tubes  Otalgia of both ears    ED Prescriptions    Medication Sig Dispense Auth. Provider   azelastine (ASTELIN) 0.1 % nasal spray Place 2 sprays into both nostrils 2 (two) times daily. 30 mL Karelly Dewalt V, PA-C   fluticasone (FLONASE) 50 MCG/ACT nasal spray Place 2 sprays into both nostrils daily. 1 g Belinda Fisher, PA-C     PDMP not reviewed this encounter.   Belinda Fisher, PA-C 08/25/20 1459

## 2020-08-25 NOTE — Discharge Instructions (Signed)
Start flonase and azelastine as directed. Follow up with ENT for further evaluation

## 2020-09-05 ENCOUNTER — Other Ambulatory Visit: Payer: Self-pay | Admitting: Internal Medicine

## 2020-09-05 ENCOUNTER — Other Ambulatory Visit: Payer: Self-pay | Admitting: Dermatology

## 2020-09-08 ENCOUNTER — Other Ambulatory Visit: Payer: Self-pay | Admitting: Internal Medicine

## 2020-10-20 DIAGNOSIS — F339 Major depressive disorder, recurrent, unspecified: Secondary | ICD-10-CM | POA: Diagnosis not present

## 2020-10-20 DIAGNOSIS — F902 Attention-deficit hyperactivity disorder, combined type: Secondary | ICD-10-CM | POA: Diagnosis not present

## 2020-10-20 DIAGNOSIS — F411 Generalized anxiety disorder: Secondary | ICD-10-CM | POA: Diagnosis not present

## 2020-10-22 ENCOUNTER — Ambulatory Visit: Payer: 59 | Admitting: Dermatology

## 2020-10-22 ENCOUNTER — Encounter: Payer: Self-pay | Admitting: Dermatology

## 2020-10-22 ENCOUNTER — Other Ambulatory Visit: Payer: Self-pay

## 2020-10-22 DIAGNOSIS — R591 Generalized enlarged lymph nodes: Secondary | ICD-10-CM

## 2020-10-22 DIAGNOSIS — L700 Acne vulgaris: Secondary | ICD-10-CM | POA: Diagnosis not present

## 2020-10-22 MED ORDER — WINLEVI 1 % EX CREA: 1.0000 "application " | TOPICAL_CREAM | Freq: Every morning | CUTANEOUS | 8 refills | Status: DC

## 2020-10-25 ENCOUNTER — Encounter: Payer: Self-pay | Admitting: Dermatology

## 2020-10-25 NOTE — Progress Notes (Signed)
° °  Follow-Up Visit   Subjective  Brittany Frazier Brittany Frazier is a 29 y.o. female who presents for the following: Acne (Patient here today for acne follow up currently using Retin- A is helping. Patient now has lumps popping up under her neck, the lumps are bothering the patient some. Patient states that she noticed the lumps about 2 weeks after she got her COVID booster.).  Acne on face plus some new under the skin bumps on neck. Location:  Duration:  Quality:  Associated Signs/Symptoms: Modifying Factors:  Severity:  Timing: Context:   Objective  Well appearing patient in no apparent distress; mood and affect are within normal limits. Objective  Head - Anterior (Face): Perhaps 50 to 60% versus active inflammatory acne on face with tretinoin plus spironolactone.  Most visible lesions on neck fit steatocystoma.  Objective  Neck - Anterior (2): Nontender, nonfluctuant anterior cervical chain three 1 cm nodes on each side.  Historically this followed a probable Covid infection several weeks ago.    A focused examination was performed including Head and neck.. Relevant physical exam findings are noted in the Assessment and Plan.   Assessment & Plan    Acne vulgaris Head - Anterior (Face)  Brittany Frazier clearly has hormone driven cystic acne.  We discussed the new class, her own topical and she is enthusiastic about this if her insurance will cover it.  She may continue her other medications concurrently.  Follow-up by MyChart or telephone in 2 months.  Ordered Medications: Clascoterone (WINLEVI) 1 % CREA  Lymphadenopathy of head and neck (2) Neck - Anterior  Patient agreeable with seeing her primary care physician if her lymph glands do not improve in 4 weeks.      I, Janalyn Harder, MD, have reviewed all documentation for this visit.  The documentation on 10/25/20 for the exam, diagnosis, procedures, and orders are all accurate and complete.

## 2020-11-14 ENCOUNTER — Other Ambulatory Visit: Payer: Self-pay | Admitting: Dermatology

## 2020-11-18 ENCOUNTER — Other Ambulatory Visit: Payer: Self-pay | Admitting: Dermatology

## 2020-12-03 ENCOUNTER — Other Ambulatory Visit (HOSPITAL_COMMUNITY): Payer: Self-pay

## 2021-01-08 ENCOUNTER — Other Ambulatory Visit: Payer: Self-pay

## 2021-01-08 ENCOUNTER — Encounter (HOSPITAL_BASED_OUTPATIENT_CLINIC_OR_DEPARTMENT_OTHER): Payer: Self-pay

## 2021-01-08 ENCOUNTER — Other Ambulatory Visit (HOSPITAL_BASED_OUTPATIENT_CLINIC_OR_DEPARTMENT_OTHER): Payer: Self-pay | Admitting: Physician Assistant

## 2021-01-08 ENCOUNTER — Emergency Department (HOSPITAL_BASED_OUTPATIENT_CLINIC_OR_DEPARTMENT_OTHER)
Admission: EM | Admit: 2021-01-08 | Discharge: 2021-01-08 | Disposition: A | Discharge status: Home / Self Care | Payer: No Typology Code available for payment source | Attending: Emergency Medicine | Admitting: Emergency Medicine

## 2021-01-08 DIAGNOSIS — R000 Tachycardia, unspecified: Secondary | ICD-10-CM | POA: Diagnosis not present

## 2021-01-08 DIAGNOSIS — F17210 Nicotine dependence, cigarettes, uncomplicated: Secondary | ICD-10-CM | POA: Insufficient documentation

## 2021-01-08 DIAGNOSIS — R509 Fever, unspecified: Secondary | ICD-10-CM | POA: Diagnosis not present

## 2021-01-08 DIAGNOSIS — R1084 Generalized abdominal pain: Secondary | ICD-10-CM | POA: Insufficient documentation

## 2021-01-08 DIAGNOSIS — R112 Nausea with vomiting, unspecified: Secondary | ICD-10-CM | POA: Diagnosis not present

## 2021-01-08 DIAGNOSIS — R197 Diarrhea, unspecified: Secondary | ICD-10-CM | POA: Insufficient documentation

## 2021-01-08 LAB — LIPASE, BLOOD: Lipase: 27 U/L (ref 11–51)

## 2021-01-08 LAB — URINALYSIS, MICROSCOPIC (REFLEX): WBC, UA: NONE SEEN WBC/hpf (ref 0–5)

## 2021-01-08 LAB — COMPREHENSIVE METABOLIC PANEL
ALT: 19 U/L (ref 0–44)
AST: 23 U/L (ref 15–41)
Albumin: 3.5 g/dL (ref 3.5–5.0)
Alkaline Phosphatase: 46 U/L (ref 38–126)
Anion gap: 11 (ref 5–15)
BUN: 9 mg/dL (ref 6–20)
CO2: 25 mmol/L (ref 22–32)
Calcium: 8.3 mg/dL — ABNORMAL LOW (ref 8.9–10.3)
Chloride: 100 mmol/L (ref 98–111)
Creatinine, Ser: 0.65 mg/dL (ref 0.44–1.00)
GFR, Estimated: >60 mL/min (ref >60)
Glucose, Bld: 102 mg/dL — ABNORMAL HIGH (ref 70–99)
Potassium: 3.5 mmol/L (ref 3.5–5.1)
Sodium: 136 mmol/L (ref 135–145)
Total Bilirubin: 0.1 mg/dL — ABNORMAL LOW (ref 0.3–1.2)
Total Protein: 6.7 g/dL (ref 6.5–8.1)

## 2021-01-08 LAB — URINALYSIS, ROUTINE W REFLEX MICROSCOPIC
Glucose, UA: NEGATIVE mg/dL
Ketones, ur: 40 mg/dL — AB
Leukocytes,Ua: NEGATIVE
Nitrite: NEGATIVE
Protein, ur: NEGATIVE mg/dL
Specific Gravity, Urine: 1.015 (ref 1.005–1.030)
pH: 6.5 (ref 5.0–8.0)

## 2021-01-08 LAB — CBC
HCT: 44.3 % (ref 36.0–46.0)
Hemoglobin: 15.1 g/dL — ABNORMAL HIGH (ref 12.0–15.0)
MCH: 33.2 pg (ref 26.0–34.0)
MCHC: 34.1 g/dL (ref 30.0–36.0)
MCV: 97.4 fL (ref 80.0–100.0)
Platelets: 376 10*3/uL (ref 150–400)
RBC: 4.55 MIL/uL (ref 3.87–5.11)
RDW: 12.9 % (ref 11.5–15.5)
WBC: 4.5 10*3/uL (ref 4.0–10.5)
nRBC: 0 % (ref 0.0–0.2)

## 2021-01-08 LAB — PREGNANCY, URINE: Preg Test, Ur: NEGATIVE

## 2021-01-08 LAB — TSH: TSH: 2.674 u[IU]/mL (ref 0.350–4.500)

## 2021-01-08 MED ORDER — SODIUM CHLORIDE 0.9 % IV BOLUS
2000.0000 mL | Freq: Once | INTRAVENOUS | Status: AC
  Administered 2021-01-08: 2000 mL via INTRAVENOUS

## 2021-01-08 MED ORDER — ONDANSETRON HCL 4 MG/2ML IJ SOLN
4.0000 mg | Freq: Once | INTRAMUSCULAR | Status: AC
  Administered 2021-01-08: 4 mg via INTRAVENOUS
  Filled 2021-01-08: qty 2

## 2021-01-08 MED ORDER — ONDANSETRON HCL 4 MG PO TABS: 4.0000 mg | ORAL_TABLET | Freq: Four times a day (QID) | ORAL | 0 refills | Status: DC

## 2021-01-08 MED ORDER — DICYCLOMINE HCL 10 MG PO CAPS
10.0000 mg | ORAL_CAPSULE | Freq: Once | ORAL | Status: AC
  Administered 2021-01-08: 10 mg via ORAL
  Filled 2021-01-08: qty 1

## 2021-01-08 MED ORDER — DICYCLOMINE HCL 20 MG PO TABS: 20.0000 mg | ORAL_TABLET | Freq: Two times a day (BID) | ORAL | 0 refills | Status: DC

## 2021-01-08 NOTE — ED Provider Notes (Signed)
[facility] EMERGENCY DEPARTMENT Provider Note   CSN: [id number] Arrival date & time: 01/08/21  1457     History Chief Complaint  Patient presents with  . Abdominal Pain    Brittany Frazier is a 30 y.o. female.  HPI   Pt is a 30 y/o female with a h/o GERD who presents to the ED today for eval of diarrhea that started 4 days ago. Describes stools as watery and yellow. She tried tums and pepto bismol and sxs improved temporarily until today when sxs worsened. She reports associated nausea and one episode of vomiting today. She reports associated diffuse abd pain. States the pain is constant, but seems to improve somewhat after BM. Denies hematochezia or melena. Reports some sweats, chills, and low grade temps at home.   Past Medical History:  Diagnosis Date  . GERD (gastroesophageal reflux disease)     Patient Active Problem List   Diagnosis Date Noted  . 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 Hirsuties 11/16/2019  . Immune to hepatitis B 05/09/2013  . Immune to varicella 05/09/2013  . Immune to measles 05/09/2013  . Immune to mumps 05/09/2013  . Immune to rubella 05/09/2013    Past Surgical History:  Procedure Laterality Date  . HERNIA REPAIR       OB History   No obstetric history on file.     Family History  Problem Relation Age of Onset  . Diabetes Paternal Grandfather   . Stroke Paternal Grandfather   . Diabetes Maternal Grandfather   . Heart disease Maternal Grandfather     Social History   Tobacco Use  . Smoking status: Current Every Day Smoker    Types: Cigarettes  . Smokeless tobacco: Never Used  Vaping Use  . Vaping Use: Never used  Substance Use Topics  . Alcohol use: Yes    Comment: weekly  . Drug use: No    Home Medications Prior to Admission medications   Medication Sig Start Date End Date Taking? Authorizing Provider  dicyclomine (BENTYL) 20 MG tablet Take 1 tablet (20 mg total) by mouth 2 (two) times daily. 01/08/21   Yes Couture, Cortni S, PA-C  ondansetron (ZOFRAN) 4 MG tablet Take 1 tablet (4 mg total) by mouth every 6 (six) hours. 01/08/21  Yes Couture, Cortni S, PA-C  amphetamine-dextroamphetamine (ADDERALL) 20 MG tablet Take 20 mg by mouth daily.    [provider]  azelastine (ASTELIN) 0.1 % nasal spray Place 2 sprays into both nostrils 2 (two) times daily. 08/25/20   Cathie Hoops, Amy V, PA-C  BLISOVI FE 1/20 1-20 MG-MCG tablet TAKE 1 TABLET BY MOUTH DAILY. 06/24/20   Cirigliano, Jearld Lesch, DO  Clascoterone (WINLEVI) 1 % CREA Apply 1 application topically every morning. 10/22/20   Janalyn Harder, MD  clobetasol (OLUX) 0.05 % topical foam APPLY TO THE AFFECTED AREA(S) TOPICALLY TWO TIMES DAILY 08/18/20   Janalyn Harder, MD  fexofenadine (ALLEGRA) 60 MG tablet Take 60 mg by mouth 2 (two) times daily.    [provider]  fluticasone (FLONASE) 50 MCG/ACT nasal spray Place 2 sprays into both nostrils daily. 08/25/20   Belinda Fisher, PA-C  propranolol (INDERAL) 20 MG tablet Take by mouth. 10/20/20   [provider]  spironolactone (ALDACTONE) 100 MG tablet TAKE 1 TABLET BY MOUTH TWO TIMES DAILY 09/08/20   Shamleffer, Konrad Dolores, MD  tretinoin (RETIN-A) 0.025 % cream APPLY TO THE AFFECTED AREA(S) AT BEDTIME 11/18/20   Janalyn Harder, MD    Allergies  Sulfa antibiotics  Review of Systems   Review of Systems  Constitutional: Positive for chills and fatigue.  HENT: Negative for ear pain and sore throat.   Eyes: Negative for visual disturbance.  Respiratory: Negative for cough and shortness of breath.   Cardiovascular: Negative for chest pain.  Gastrointestinal: Positive for abdominal pain, diarrhea, nausea and vomiting.  Genitourinary: Negative for dysuria and hematuria.  Musculoskeletal: Negative for back pain.  Skin: Negative for rash.  Neurological: Negative for seizures and syncope.  All other systems reviewed and are negative.   Physical Exam Updated Vital Signs BP 112/76   Pulse  (!) 111   Temp 98.6 F (37 C) (Oral)   Resp 18   LMP 12/25/2020   SpO2 100%   Physical Exam Vitals and nursing note reviewed.  Constitutional:      General: She is not in acute distress.    Appearance: She is well-developed.  HENT:     Head: Normocephalic and atraumatic.  Eyes:     Conjunctiva/sclera: Conjunctivae normal.  Cardiovascular:     Rate and Rhythm: Regular rhythm. Tachycardia present.     Heart sounds: Normal heart sounds. No murmur heard.   Pulmonary:     Effort: Pulmonary effort is normal. No respiratory distress.     Breath sounds: Normal breath sounds. No wheezing, rhonchi or rales.  Abdominal:     General: Bowel sounds are normal.     Palpations: Abdomen is soft.     Tenderness: There is generalized abdominal tenderness. There is no guarding or rebound.  Musculoskeletal:     Cervical back: Neck supple.  Skin:    General: Skin is warm and dry.  Neurological:     Mental Status: She is alert.     ED Results / Procedures / Treatments   Labs (all labs ordered are listed, but only abnormal results are displayed) Labs Reviewed  COMPREHENSIVE METABOLIC PANEL - Abnormal; Notable for the following components:      Result Value   Glucose, Bld 102 (*)    Calcium 8.3 (*)    Total Bilirubin 0.1 (*)    All other components within normal limits  CBC - Abnormal; Notable for the following components:   Hemoglobin 15.1 (*)    All other components within normal limits  URINALYSIS, ROUTINE W REFLEX MICROSCOPIC - Abnormal; Notable for the following components:   Hgb urine dipstick MODERATE (*)    Bilirubin Urine SMALL (*)    Ketones, ur 40 (*)    All other components within normal limits  URINALYSIS, MICROSCOPIC (REFLEX) - Abnormal; Notable for the following components:   Bacteria, UA RARE (*)    All other components within normal limits  LIPASE, BLOOD  PREGNANCY, URINE  TSH    EKG None  Radiology No results found.  Procedures Procedures   Medications  Ordered in ED Medications  sodium chloride 0.9 % bolus 2,000 mL (2,000 mLs Intravenous New Bag/Given 01/08/21 1757)  ondansetron (ZOFRAN) injection 4 mg (4 mg Intravenous Given 01/08/21 1758)  dicyclomine (BENTYL) capsule 10 mg (10 mg Oral Given 01/08/21 1758)    ED Course  I have reviewed the triage vital signs and the nursing notes.  Pertinent labs & imaging results that were available during my care of the patient were reviewed by me and considered in my medical decision making (see chart for details).    MDM Rules/Calculators/A&P  Patient with symptoms consistent with viral gastroenteritis.  Abdominal exam is reassuring.  Labs reviewed and are also reassuring.  She is somewhat tachycardic but the remainder of her vital signs are reassuring.  On chart review she does have a history of persistent tachycardia.  I do not see any recent thyroid studies that this was added to the patient's work-up and she can follow-up on this as an outpatient.  No fever.  No signs of dehydration, tolerating PO fluids > 6 oz.  Lungs are clear.  No focal abdominal pain, no concern for appendicitis, cholecystitis, pancreatitis, ruptured viscus, UTI, kidney stone, or any other abdominal etiology.  She was given IV fluids, antiemetics and Bentyl in the ED and on reassessment she states she feels significantly improved.  Her resting heart rate was now at 96 on my final reevaluation.   supportive therapy indicated with return if symptoms worsen.  Patient counseled on plan for follow-up about her TSH and strict return precautions.  She voices understanding of the plan and reasons to return.  All questions answered.  Patient stable for discharge.  Final Clinical Impression(s) / ED Diagnoses Final diagnoses:  Nausea vomiting and diarrhea  Tachycardia    Rx / DC Orders ED Discharge Orders         Ordered    ondansetron (ZOFRAN) 4 MG tablet  Every 6 hours        01/08/21 1918    dicyclomine  (BENTYL) 20 MG tablet  2 times daily        01/08/21 [address] PA-C 01/08/21 1918    Charlynne Pander, MD 01/08/21 [phone]

## 2021-01-08 NOTE — ED Triage Notes (Signed)
Pt c/o abd pain, diarrhea x 4 days-vomiting x 30 min-NAD-steady gait

## 2021-01-08 NOTE — Discharge Instructions (Signed)
You were given a prescription for zofran to help with your nausea. Please take as directed.  Take bentyl as directed for abdominal cramping.   Follow up with your doctor in regards to your pending thyroid studies.  Please follow up with your primary doctor within the next 5-7 days.  If you do not have a primary care provider, information for a healthcare clinic has been provided for you to make arrangements for follow up care. Please return to the ER sooner if you have any new or worsening symptoms, or if you have any of the following symptoms:  Abdominal pain that does not go away.  You have a fever.  You keep throwing up (vomiting).  The pain is felt only in portions of the abdomen. Pain in the right side could possibly be appendicitis. In an adult, pain in the left lower portion of the abdomen could be colitis or diverticulitis.  You pass bloody or black tarry stools.  There is bright red blood in the stool.  The constipation stays for more than 4 days.  There is belly (abdominal) or rectal pain.  You do not seem to be getting better.  You have any questions or concerns.

## 2021-01-23 ENCOUNTER — Other Ambulatory Visit (HOSPITAL_BASED_OUTPATIENT_CLINIC_OR_DEPARTMENT_OTHER): Payer: Self-pay

## 2021-01-29 ENCOUNTER — Other Ambulatory Visit: Payer: Self-pay | Admitting: Dermatology

## 2021-03-18 ENCOUNTER — Ambulatory Visit (HOSPITAL_COMMUNITY)
Admission: EM | Admit: 2021-03-18 | Discharge: 2021-03-18 | Disposition: A | Discharge status: Home / Self Care | Payer: No Typology Code available for payment source | Attending: Psychiatry | Admitting: Psychiatry

## 2021-03-18 ENCOUNTER — Other Ambulatory Visit: Payer: Self-pay

## 2021-03-18 DIAGNOSIS — F4310 Post-traumatic stress disorder, unspecified: Secondary | ICD-10-CM
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
## 2021-03-18 NOTE — Discharge Instructions (Signed)
Patient is instructed prior to discharge to: Take all medications as prescribed by his/her mental healthcare provider.  In the event of worsening symptoms, patient is instructed to call the crisis hotline, 911 and or go to the nearest ED for appropriate evaluation and treatment of symptoms. To follow-up with his/her primary care provider for your other medical issues, concerns and or health care needs.

## 2021-03-18 NOTE — ED Notes (Signed)
Alert and oriented  X 4 Denies SI, HI and AVH. Patient received after visit summary with community resources. Patient left with belongings and with sister.
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
## 2021-03-24 ENCOUNTER — Other Ambulatory Visit (HOSPITAL_COMMUNITY): Payer: Self-pay

## 2021-03-24 ENCOUNTER — Other Ambulatory Visit: Payer: Self-pay | Admitting: Dermatology

## 2021-03-24 MED ORDER — TRETINOIN 0.025 % EX CREA
TOPICAL_CREAM | CUTANEOUS | 2 refills | Status: AC
  Filled 2021-03-24: qty 45, 30d supply, fill #0
  Filled 2021-07-14 – 2021-07-29 (×2): qty 45, 30d supply, fill #1
  Filled 2021-10-05: qty 45, 30d supply, fill #2

## 2021-03-24 MED FILL — Norethindrone Ace & Ethinyl Estradiol-FE Tab 1 MG-20 MCG: ORAL | 84 days supply | Qty: 84 | Fill #0 | Status: CN

## 2021-03-25 ENCOUNTER — Other Ambulatory Visit: Payer: Self-pay

## 2021-03-25 ENCOUNTER — Other Ambulatory Visit (HOSPITAL_COMMUNITY): Payer: Self-pay

## 2021-03-25 ENCOUNTER — Encounter: Payer: Self-pay | Admitting: Family Medicine

## 2021-03-25 ENCOUNTER — Ambulatory Visit (INDEPENDENT_AMBULATORY_CARE_PROVIDER_SITE_OTHER): Payer: No Typology Code available for payment source | Admitting: Family Medicine

## 2021-03-25 VITALS — BP 128/76 | HR 106 | Temp 97.8°F | Resp 16 | Ht 63.0 in | Wt 139.0 lb

## 2021-03-25 DIAGNOSIS — F4310 Post-traumatic stress disorder, unspecified: Secondary | ICD-10-CM | POA: Diagnosis not present

## 2021-03-25 DIAGNOSIS — F4329 Adjustment disorder with other symptoms: Secondary | ICD-10-CM

## 2021-03-25 DIAGNOSIS — F419 Anxiety disorder, unspecified: Secondary | ICD-10-CM | POA: Diagnosis not present

## 2021-03-25 MED ORDER — HYDROXYZINE PAMOATE 25 MG PO CAPS
25.0000 mg | ORAL_CAPSULE | Freq: Three times a day (TID) | ORAL | 0 refills | Status: DC | PRN
  Filled 2021-03-25: qty 30, 10d supply, fill #0

## 2021-03-25 NOTE — Patient Instructions (Signed)
[facility]: [email or web address]  [facility] [email or web address]  [email or web address]  [facility] [email or web address]  [email or web address]  
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
## 2021-03-26 ENCOUNTER — Other Ambulatory Visit (HOSPITAL_COMMUNITY): Payer: Self-pay

## 2021-03-27 ENCOUNTER — Encounter: Payer: Self-pay | Admitting: Family Medicine

## 2021-03-27 ENCOUNTER — Ambulatory Visit: Payer: No Typology Code available for payment source | Admitting: Family Medicine

## 2021-04-01 ENCOUNTER — Encounter: Payer: Self-pay | Admitting: Family Medicine

## 2021-04-24 ENCOUNTER — Other Ambulatory Visit (HOSPITAL_COMMUNITY): Payer: Self-pay

## 2021-04-24 MED FILL — Norethindrone Ace & Ethinyl Estradiol-FE Tab 1 MG-20 MCG: ORAL | 84 days supply | Qty: 84 | Fill #0 | Status: AC

## 2021-05-14 ENCOUNTER — Other Ambulatory Visit: Payer: Self-pay

## 2021-05-15 ENCOUNTER — Encounter: Payer: Self-pay | Admitting: Family Medicine

## 2021-05-15 ENCOUNTER — Ambulatory Visit (INDEPENDENT_AMBULATORY_CARE_PROVIDER_SITE_OTHER): Payer: No Typology Code available for payment source | Admitting: Family Medicine

## 2021-05-15 VITALS — BP 110/70 | HR 100 | Temp 97.4°F | Ht 63.0 in | Wt 186.0 lb

## 2021-05-15 DIAGNOSIS — F4310 Post-traumatic stress disorder, unspecified: Secondary | ICD-10-CM

## 2021-05-15 DIAGNOSIS — F419 Anxiety disorder, unspecified: Secondary | ICD-10-CM

## 2021-05-15 NOTE — Progress Notes (Signed)
Brittany Frazier is a 30 y.o. female  Chief Complaint  Patient presents with   Follow-up    Pt here for FMLA f/u. No other concerns    HPI: Brittany Frazier is a 30 y.o. female patient seen today for f/u on anxiety. She last saw me on 03/25/21 and I completed FMLA paperwork for 6 wks of leave. She was started on vistaril 58m TID PRN. She states she did not have to take this. She is taking melatonin gummies qHS.  Today she reports things are "so much better" overall. She is sleeping well overall. Appetite is good - maybe overeating. She feels more comfortable being on her own.  She goes back to work in 1 week and is excited and also nervous.   PHQ-9 = 9 GAD-7 = 14  Past Medical History:  Diagnosis Date   GERD (gastroesophageal reflux disease)     Past Surgical History:  Procedure Laterality Date   HERNIA REPAIR      Social History   Socioeconomic History   Marital status: Single    Spouse name: Not on file   Number of children: Not on file   Years of education: Not on file   Highest education level: Not on file  Occupational History   Not on file  Tobacco Use   Smoking status: Every Day    Types: Cigarettes   Smokeless tobacco: Never  Vaping Use   Vaping Use: Never used  Substance and Sexual Activity   Alcohol use: Yes    Comment: weekly   Drug use: No   Sexual activity: Not on file  Other Topics Concern   Not on file  Social History Narrative   Not on file   Social Determinants of Health   Financial Resource Strain: Not on file  Food Insecurity: Not on file  Transportation Needs: Not on file  Physical Activity: Not on file  Stress: Not on file  Social Connections: Not on file  Intimate Partner Violence: Not on file    Family History  Problem Relation Age of Onset   Diabetes Paternal Grandfather    Stroke Paternal Grandfather    Diabetes Maternal Grandfather    Heart disease Maternal Grandfather      Immunization History  Administered  Date(s) Administered   Influenza Split 01/19/2013   Influenza,inj,Quad PF,6+ Mos 07/08/2014   MMR 01/20/2013   PPD Test 01/19/2013, 05/07/2013   Td 01/16/2013   Tdap 08/04/2007    Outpatient Encounter Medications as of 05/15/2021  Medication Sig Note   amphetamine-dextroamphetamine (ADDERALL) 20 MG tablet Take 20 mg by mouth daily. (Patient not taking: Reported on 03/25/2021) 04/07/2018: Per pt taking three times a day   azelastine (ASTELIN) 0.1 % nasal spray Place 2 sprays into both nostrils 2 (two) times daily.    Azelastine HCl 137 MCG/SPRAY SOLN INSTILL 2 SPRAYS INTO BOTH NOSTRILS TWICE DAILY    Clascoterone (WINLEVI) 1 % CREA Apply 1 application topically every morning.    clobetasol (OLUX) 0.05 % topical foam APPLY TO THE AFFECTED AREA(S) TOPICALLY TWO TIMES DAILY    dicyclomine (BENTYL) 20 MG tablet TAKE 1 TABLET BY MOUTH 2 TIMES DAILY    fexofenadine (ALLEGRA) 60 MG tablet Take 60 mg by mouth 2 (two) times daily.    fluticasone (FLONASE) 50 MCG/ACT nasal spray INSTILL 2 SPRAYS INTO BOTH NOSTRILS DAILY    hydrOXYzine (VISTARIL) 25 MG capsule Take 1 capsule (25 mg total) by mouth every 8 (eight) hours as needed  for anxiety.    norethindrone-ethinyl estradiol-FE (LOESTRIN FE) 1-20 MG-MCG tablet TAKE 1 TABLET BY MOUTH ONCE DAILY    ondansetron (ZOFRAN) 4 MG tablet TAKE 1 TABLET BY MOUTH EVERY 6 HOURS AS DIRECTED    propranolol (INDERAL) 20 MG tablet Take by mouth.    propranolol (INDERAL) 20 MG tablet TAKE 1 TABLET BY MOUTH EVERY 6 HOURS AS NEEDED FOR ANXIETY    spironolactone (ALDACTONE) 100 MG tablet TAKE 1 TABLET BY MOUTH TWO TIMES DAILY    tretinoin (RETIN-A) 0.025 % cream APPLY TO THE AFFECTED AREA(S) AT BEDTIME    No facility-administered encounter medications on file as of 05/15/2021.     ROS: Pertinent positives and negatives noted in HPI. Remainder of ROS non-contributory   Allergies  Allergen Reactions   Sulfa Antibiotics Rash    BP 110/70 (BP Location: Left Arm,  Patient Position: Sitting, Cuff Size: Normal)   Pulse 100   Temp (!) 97.4 F (36.3 C) (Temporal)   Ht '5\' 3"'  (1.6 m)   Wt 186 lb (84.4 kg)   LMP 05/10/2021 (Exact Date)   SpO2 97%   BMI 32.95 kg/m  Wt Readings from Last 3 Encounters:  05/15/21 186 lb (84.4 kg)  03/25/21 139 lb (63 kg)  06/20/20 162 lb 3.2 oz (73.6 kg)   Temp Readings from Last 3 Encounters:  05/15/21 (!) 97.4 F (36.3 C) (Temporal)  03/25/21 97.8 F (36.6 C)  01/08/21 98.7 F (37.1 C) (Oral)   BP Readings from Last 3 Encounters:  05/15/21 110/70  03/25/21 128/76  01/08/21 101/61   Pulse Readings from Last 3 Encounters:  05/15/21 100  03/25/21 (!) 106  01/08/21 (!) 105     Physical Exam Constitutional:      General: She is not in acute distress.    Appearance: She is not ill-appearing.  Pulmonary:     Effort: No respiratory distress.  Neurological:     Mental Status: She is alert and oriented to person, place, and time.  Psychiatric:        Mood and Affect: Mood normal.        Behavior: Behavior normal.     A/P:  1. Anxiety 2. PTSD (post-traumatic stress disorder) - symptoms much-improved, not taking any Rx med - returns to work next week and is ready for that - work note given to return to work - f/u PRN. She will see Wilfred Lacy for her care going forward    This visit occurred during the SARS-CoV-2 public health emergency.  Safety protocols were in place, including screening questions prior to the visit, additional usage of staff PPE, and extensive cleaning of exam room while observing appropriate contact time as indicated for disinfecting solutions.

## 2021-06-16 ENCOUNTER — Telehealth: Payer: No Typology Code available for payment source | Admitting: Family Medicine

## 2021-06-16 DIAGNOSIS — H5789 Other specified disorders of eye and adnexa: Secondary | ICD-10-CM

## 2021-06-16 MED ORDER — BACITRACIN-POLYMYXIN B 500-10000 UNIT/GM OP OINT
1.0000 | TOPICAL_OINTMENT | Freq: Two times a day (BID) | OPHTHALMIC | 0 refills | Status: DC
Start: 2021-06-16 — End: 2021-07-14

## 2021-06-16 NOTE — Progress Notes (Signed)
I provided 5 minutes of non face-to-face time during this encounter for chart review, medication and order placement, as well as and documentation.   . 

## 2021-06-16 NOTE — Progress Notes (Signed)

## 2021-06-26 ENCOUNTER — Ambulatory Visit: Payer: 59 | Admitting: Internal Medicine

## 2021-07-14 ENCOUNTER — Encounter: Payer: Self-pay | Admitting: Family Medicine

## 2021-07-14 ENCOUNTER — Ambulatory Visit (INDEPENDENT_AMBULATORY_CARE_PROVIDER_SITE_OTHER): Payer: No Typology Code available for payment source | Admitting: Family Medicine

## 2021-07-14 ENCOUNTER — Ambulatory Visit: Payer: No Typology Code available for payment source | Admitting: Internal Medicine

## 2021-07-14 ENCOUNTER — Other Ambulatory Visit: Payer: Self-pay | Admitting: Family Medicine

## 2021-07-14 ENCOUNTER — Other Ambulatory Visit (HOSPITAL_COMMUNITY): Payer: Self-pay

## 2021-07-14 ENCOUNTER — Other Ambulatory Visit: Payer: Self-pay

## 2021-07-14 VITALS — BP 110/76 | HR 91 | Temp 97.8°F | Ht 63.0 in | Wt 188.2 lb

## 2021-07-14 DIAGNOSIS — F418 Other specified anxiety disorders: Secondary | ICD-10-CM

## 2021-07-14 DIAGNOSIS — L700 Acne vulgaris: Secondary | ICD-10-CM | POA: Diagnosis not present

## 2021-07-14 DIAGNOSIS — F411 Generalized anxiety disorder: Secondary | ICD-10-CM | POA: Insufficient documentation

## 2021-07-14 DIAGNOSIS — Z309 Encounter for contraceptive management, unspecified: Secondary | ICD-10-CM

## 2021-07-14 DIAGNOSIS — Z6832 Body mass index (BMI) 32.0-32.9, adult: Secondary | ICD-10-CM | POA: Insufficient documentation

## 2021-07-14 DIAGNOSIS — E6609 Other obesity due to excess calories: Secondary | ICD-10-CM | POA: Insufficient documentation

## 2021-07-14 DIAGNOSIS — E669 Obesity, unspecified: Secondary | ICD-10-CM

## 2021-07-14 DIAGNOSIS — L680 Hirsutism: Secondary | ICD-10-CM | POA: Diagnosis not present

## 2021-07-14 MED ORDER — NORETHIN ACE-ETH ESTRAD-FE 1-20 MG-MCG PO TABS
1.0000 | ORAL_TABLET | Freq: Every day | ORAL | 3 refills | Status: DC
Start: 2021-07-14 — End: 2022-06-15
  Filled 2021-07-14: qty 84, 84d supply, fill #0
  Filled 2021-10-05: qty 84, 84d supply, fill #1
  Filled 2021-12-16: qty 84, 84d supply, fill #2
  Filled 2022-03-08: qty 84, 84d supply, fill #3

## 2021-07-14 NOTE — Progress Notes (Signed)
[facility] [address] [place] [address] Dept: [phone] Dept Fax: [phone]  Office Visit  Subjective:    Patient ID: Brittany Frazier, female    DOB: 1990/11/09, 30 y.o..   MRN: [id number]  Chief Complaint  Patient presents with   Transitions Of Care    Est care. Pt requesting multiple referrals.     History of Present Illness:  Patient is in today to request several referrals. [name] has ahd a change in her insurance, so needs referrals to continue to receive care with her existing specialist.  [name] has a history of acne. She has been managed by Dr. Jorja Loa, primarily using Retin-A. She needs a follow-up with him to continue her therapy.  [name] has a history of hirsutism and an elevated testosterone level. She is seen by Dr. Lonzo Cloud (endocrinology). She is managed on spironolactone. She is due for follow-up with her.  [name] has a history of depression with an occasional panic attack. She is now seeing [name] with [facility]. He has her managed on Lexapro 10 mg (she is currently in the ramp up phase to this dose). She was referred to a therapist, but this was not a Biomedical engineer. [name] does request a referral for this. We did discuss a panic attack she had recently. She describes a feeling of impending doom, tachycardia, and fear. She was seen in an ED, which found no underlying cardiac issue and felt this was a panic attack.  [name] is concerned about obesity. She would like to be referred for medical weight management with Delrae Rend MD in [place]. She notes 40 lbs of weight gain in the past 9-10 months.  [name] also requests a refill of her OCPs. She is using these without problems.  Past Medical History: Patient Active Problem List   Diagnosis Date Noted   Acne vulgaris 07/14/2021   Depression with anxiety 07/14/2021   Obesity (BMI 30.0-34.9)  07/14/2021   Elevated testosterone level 11/16/2019   Hirsutism 11/16/2019   Past Surgical History:  Procedure Laterality Date   HERNIA REPAIR     Family History  Problem Relation Age of Onset   Diabetes Paternal Grandfather    Stroke Paternal Grandfather    Diabetes Maternal Grandfather    Heart disease Maternal Grandfather    Outpatient Medications Prior to Visit  Medication Sig Dispense Refill   escitalopram (LEXAPRO) 10 MG tablet Take 10 mg by mouth daily.     norethindrone-ethinyl estradiol-FE (LOESTRIN FE) 1-20 MG-MCG tablet TAKE 1 TABLET BY MOUTH ONCE DAILY 84 tablet 3   spironolactone (ALDACTONE) 100 MG tablet TAKE 1 TABLET BY MOUTH TWO TIMES DAILY 180 tablet 1   tretinoin (RETIN-A) 0.025 % cream APPLY TO THE AFFECTED AREA(S) AT BEDTIME 45 g 2   bacitracin-polymyxin b (POLYSPORIN) ophthalmic ointment Place 1 application into the right eye every 12 (twelve) hours. apply to eye every 12 hours while awake 3.5 g 0   No facility-administered medications prior to visit.   Allergies  Allergen Reactions   Sulfa Antibiotics Rash    Objective:   Today's Vitals   07/14/21 1434  BP: 110/76  Pulse: 91  Temp: 97.8 F (36.6 C)  TempSrc: Temporal  SpO2: 97%  Weight: 188 lb 3.2 oz (85.4 kg)  Height: 5\' 3"  (1.6 m)   Body mass index is 33.34 kg/m.   General: Well developed, well nourished. No acute distress. Psych: Alert  and oriented. Normal mood and affect.  Health Maintenance Due  Topic Date Due   COVID-19 Vaccine (1) Never done   Pneumococcal Vaccine 44-90 Years old (1 - PCV) Never done   Hepatitis C Screening  Never done   PAP-Cervical Cytology Screening  Never done   PAP SMEAR-Modifier  04/21/2019   INFLUENZA VACCINE  06/01/2021     Assessment & Plan:   1. Encounter for contraceptive management, unspecified type I will renew her OCPS. [name] will follow-up with me for a ToC appointment in December. We will address her other women's health needs at that  time.  - norethindrone-ethinyl estradiol-FE (LOESTRIN FE) 1-20 MG-MCG tablet; TAKE 1 TABLET BY MOUTH ONCE DAILY  Dispense: 84 tablet; Refill: 3  2. Acne vulgaris Stable on Retin-A. I will refer her back to dermatology for ongoing monitoring.  - Ambulatory referral to Dermatology  3. Depression with anxiety Referrals to continue to work with [name] and referral for counselign placed. We did discuss deep breathing and positive cognitive messaging to control panic episodes.  - Ambulatory referral to Psychiatry - Ambulatory referral to Psychology  4. Hirsutism I will refer [name] back to endocrinology for ongoing monitoring.  - Ambulatory referral to Endocrinology  5. Obesity (BMI 30.0-34.9) I will refer [name] for medical weight management.  - Amb Ref to Medical Weight Management  Loyola Mast, MD

## 2021-07-15 ENCOUNTER — Telehealth: Payer: Self-pay | Admitting: *Deleted

## 2021-07-15 ENCOUNTER — Other Ambulatory Visit (HOSPITAL_COMMUNITY): Payer: Self-pay

## 2021-07-15 NOTE — Telephone Encounter (Signed)
Faxed patient prior authorization for tretinoin to [employer] @ [phone].

## 2021-07-15 NOTE — Telephone Encounter (Signed)
Received prior authorization for tretinoin 0.025% cream- done via cover my meds.   Fax sent to the plan Your PA has been faxed to the plan as a paper copy. Please contact the plan directly if you haven't received a determination in a typical timeframe.  You will be notified of the determination via fax. How do I know if the plan approved the PA?  Add Reminder to your Dashboard Remind me in:  5 business days Contact plan to follow up on [id number] Download / Print PAReturn to RequestVerify PrescribersReturn to Dashboard

## 2021-07-16 ENCOUNTER — Other Ambulatory Visit (HOSPITAL_COMMUNITY): Payer: Self-pay

## 2021-07-17 ENCOUNTER — Other Ambulatory Visit (HOSPITAL_COMMUNITY): Payer: Self-pay

## 2021-07-20 ENCOUNTER — Other Ambulatory Visit (HOSPITAL_COMMUNITY): Payer: Self-pay

## 2021-07-21 ENCOUNTER — Other Ambulatory Visit (HOSPITAL_COMMUNITY): Payer: Self-pay

## 2021-07-22 ENCOUNTER — Other Ambulatory Visit (HOSPITAL_COMMUNITY): Payer: Self-pay

## 2021-07-24 ENCOUNTER — Other Ambulatory Visit (HOSPITAL_COMMUNITY): Payer: Self-pay

## 2021-07-27 ENCOUNTER — Other Ambulatory Visit (HOSPITAL_COMMUNITY): Payer: Self-pay

## 2021-07-29 ENCOUNTER — Other Ambulatory Visit (HOSPITAL_COMMUNITY): Payer: Self-pay

## 2021-07-31 ENCOUNTER — Other Ambulatory Visit (HOSPITAL_COMMUNITY): Payer: Self-pay

## 2021-07-31 MED ORDER — PRAZOSIN HCL 1 MG PO CAPS
ORAL_CAPSULE | ORAL | 0 refills | Status: DC
  Filled 2021-07-31: qty 30, 30d supply, fill #0

## 2021-07-31 MED ORDER — ESCITALOPRAM OXALATE 10 MG PO TABS
ORAL_TABLET | Freq: Every day | ORAL | 1 refills | Status: DC
  Filled 2021-07-31: qty 30, 30d supply, fill #0

## 2021-08-01 ENCOUNTER — Other Ambulatory Visit (HOSPITAL_COMMUNITY): Payer: Self-pay

## 2021-08-03 ENCOUNTER — Other Ambulatory Visit (HOSPITAL_COMMUNITY): Payer: Self-pay

## 2021-08-10 ENCOUNTER — Ambulatory Visit (INDEPENDENT_AMBULATORY_CARE_PROVIDER_SITE_OTHER): Payer: No Typology Code available for payment source | Admitting: Psychology

## 2021-08-10 DIAGNOSIS — F4323 Adjustment disorder with mixed anxiety and depressed mood: Secondary | ICD-10-CM | POA: Diagnosis not present

## 2021-08-24 ENCOUNTER — Ambulatory Visit (INDEPENDENT_AMBULATORY_CARE_PROVIDER_SITE_OTHER): Payer: No Typology Code available for payment source | Admitting: Psychology

## 2021-08-24 DIAGNOSIS — F4323 Adjustment disorder with mixed anxiety and depressed mood: Secondary | ICD-10-CM | POA: Diagnosis not present

## 2021-08-25 ENCOUNTER — Ambulatory Visit (INDEPENDENT_AMBULATORY_CARE_PROVIDER_SITE_OTHER): Payer: No Typology Code available for payment source | Admitting: Internal Medicine

## 2021-08-25 ENCOUNTER — Other Ambulatory Visit (HOSPITAL_COMMUNITY): Payer: Self-pay

## 2021-08-25 ENCOUNTER — Other Ambulatory Visit: Payer: Self-pay

## 2021-08-25 ENCOUNTER — Encounter: Payer: Self-pay | Admitting: Internal Medicine

## 2021-08-25 VITALS — BP 100/72 | HR 86 | Ht 63.0 in | Wt 193.0 lb

## 2021-08-25 DIAGNOSIS — L680 Hirsutism: Secondary | ICD-10-CM

## 2021-08-25 MED ORDER — SPIRONOLACTONE 100 MG PO TABS
100.0000 mg | ORAL_TABLET | Freq: Two times a day (BID) | ORAL | 3 refills | Status: DC
  Filled 2021-08-25: qty 180, 90d supply, fill #0
  Filled 2022-01-13: qty 180, 90d supply, fill #1
  Filled 2022-07-22: qty 180, 90d supply, fill #2

## 2021-08-25 NOTE — Progress Notes (Signed)
Name: Brittany Frazier  MRN/ DOB: [id number], 1991-03-17    Age/ Sex: 30 y.o., female     PCP: Loyola Mast, MD   Reason for Endocrinology Evaluation: Hirsutism      Initial Endocrinology Clinic Visit: 05/11/2019    PATIENT IDENTIFIER: [name] is a 30 y.o., female with a past medical history of Cystic acne( on isotretinoin), hidradenitis suppurativa (previously on humira), steatocytoma multiplex, and ADD . She has followed with [facility] since 05/11/2019 for consultative assistance with management of her Hirsutism.   HISTORICAL SUMMARY:  Pt was referred by Dr. Sindy Messing (Dermatolgoy ) for previously elevated  Testosterone. She has seen Dr. Talmage Nap in the past.  She was prescribed metformin at the time due to a diagnosis of pre-diabtes but was lost to follow up and did not continue to take it. She lost a lot of weight with lifestyle changes after college.   She has been  treated for severe cystic acne, and steatocytoma multiplex. She was on accutane and spironolactone 50 mg daily for~ 8 months through dermatology.   Menarche at 23 yrs of age with regular period  But with  dysmenorrhea  Has been  on Junel since 2018 Sprironolactone added 05/2019 SUBJECTIVE:    Today (08/25/2021):  Brittany Frazier is here for a follow up on cystic acne, hx of elevated testosterone and hirsutism. She has been taking spironolactone without side effects  Has noted decrease in hair growth over the face  Denies dizziness, denies polydipsia  Had hx of edema   She is on topical trenoin through dermatology   She is concerned about weight gain   HISTORY:  Past Medical History:  Past Medical History:  Diagnosis Date   GERD (gastroesophageal reflux disease)    Past Surgical History:  Past Surgical History:  Procedure Laterality Date   HERNIA REPAIR     Social History:  reports that she has been smoking cigarettes. She has never used smokeless tobacco. She  reports current alcohol use. She reports that she does not use drugs. Family History:  Family History  Problem Relation Age of Onset   Diabetes Paternal Grandfather    Stroke Paternal Grandfather    Diabetes Maternal Grandfather    Heart disease Maternal Grandfather      HOME MEDICATIONS: Allergies as of 08/25/2021       Reactions   Sulfa Antibiotics Rash        Medication List        Accurate as of August 25, 2021  2:57 PM. If you have any questions, ask your nurse or doctor.          Blisovi FE 1/20 1-20 MG-MCG tablet Generic drug: norethindrone-ethinyl estradiol-FE TAKE 1 TABLET BY MOUTH ONCE DAILY   escitalopram 10 MG tablet Commonly known as: LEXAPRO Take 1 tablet by mouth daily What changed: Another medication with the same name was removed. Continue taking this medication, and follow the directions you see here. Changed by: Scarlette Shorts, MD   prazosin 1 MG capsule Commonly known as: MINIPRESS Take 1 capsule by mouth at night   spironolactone 100 MG tablet Commonly known as: ALDACTONE Take 1 tablet by mouth 2 (two) times daily. What changed: how much to take Changed by: Scarlette Shorts, MD   tretinoin 0.025 % cream Commonly known as: RETIN-A APPLY TO THE AFFECTED AREA(S) AT BEDTIME          OBJECTIVE:   PHYSICAL EXAM:  VS: BP 100/72 (BP Location: Left Arm, Patient Position: Sitting, Cuff Size: Small)   Pulse 86   Ht 5\' 3"  (1.6 m)   Wt 193 lb (87.5 kg)   SpO2 99%   BMI 34.19 kg/m    EXAM: General: Pt appears well and is in NAD  Neck: General: Supple without adenopathy. Thyroid: Thyroid size normal.  No goiter or nodules appreciated.  Lungs: Clear with good BS bilat with no rales, rhonchi, or wheezes  Heart: Auscultation: RRR.  Abdomen: Normoactive bowel sounds, soft, nontender, without masses or organomegaly palpable  Extremities:  BL LE: No pretibial edema normal ROM and strength.  Skin: Hair: Texture and amount normal  with gender appropriate distribution Skin Inspection: facial and neck scarring  Skin Palpation: Skin temperature, texture, and thickness normal to palpation  Mental Status: Judgment, insight: Intact Orientation: Oriented to time, place, and person Mood and affect: No depression, anxiety, or agitation     DATA REVIEWED:   Results for Brittany Frazier, Brittany Frazier (MRN Ival Bible) as of 08/26/2021 14:32  Ref. Range 08/25/2021 15:08  Sodium Latest Ref Range: 135 - 145 mEq/L 138  Potassium Latest Ref Range: 3.5 - 5.1 mEq/L 4.2  Chloride Latest Ref Range: 96 - 112 mEq/L 107  CO2 Latest Ref Range: 19 - 32 mEq/L 25  Glucose Latest Ref Range: 70 - 99 mg/dL 98  BUN Latest Ref Range: 6 - 23 mg/dL 11  Creatinine Latest Ref Range: 0.40 - 1.20 mg/dL 08/27/2021  Calcium Latest Ref Range: 8.4 - 10.5 mg/dL 8.8  Alkaline Phosphatase Latest Ref Range: 39 - 117 U/L 59  Albumin Latest Ref Range: 3.5 - 5.2 g/dL 3.8  AST Latest Ref Range: 0 - 37 U/L 16  ALT Latest Ref Range: 0 - 35 U/L 14  Total Protein Latest Ref Range: 6.0 - 8.3 g/dL 6.4  Total Bilirubin Latest Ref Range: 0.2 - 1.2 mg/dL 0.3  GFR Latest Ref Range: >60.00 mL/min 119.88  TSH Latest Ref Range: 0.35 - 5.50 uIU/mL 2.41  T4,Free(Direct) Latest Ref Range: 0.60 - 1.60 ng/dL 1.69     6.78   FSH 2.1 mIU/mL  LH 4.2 mIU/mL  Total testosterone 39.03 ng/dL  Free testosterone  8.0 pg/mL (reference 0.6-6.8)     Prolactin 10.9 ng/mL DHEAS 229 ug/dL    ASSESSMENT / PLAN / RECOMMENDATIONS:   Hirsutism/ Hx of Elevated Testosterone :    - Pt with symptoms of acne and hirsutism despite being on OCP's since 2018. We added Spironolactone in 7/ 2020  - Discussed side effects of teratogenicity and the importance of being on OCP's while on spironolactone - Hirsutism has resolved but she continues to follow up with dermatology for acne   Medications  Continue  spironolactone 100 mg BID  Continue Junel      F/U in 1 yr    Signed electronically  by: 2021, MD  [facility] Endocrinology  [facility] [address] [place] [place], [place] Phone: [phone] FAX: [phone]      CC: [phone], MD [address] [place] Phone: [phone]  Fax: [phone]   Return to Endocrinology clinic as below: Future Appointments  Date Time Provider Department Center  09/08/2021  2:00 PM 13/06/2021 [id number] None  10/07/2021  2:00 PM 14/05/2021, MD [id number]  11/10/2021  3:30 PM 01/08/2022, MD [facility]

## 2021-08-25 NOTE — Patient Instructions (Signed)
-   Continue  Spironolactone 100 mg,twice daily

## 2021-08-26 LAB — COMPREHENSIVE METABOLIC PANEL
ALT: 14 U/L (ref 0–35)
AST: 16 U/L (ref 0–37)
Albumin: 3.8 g/dL (ref 3.5–5.2)
Alkaline Phosphatase: 59 U/L (ref 39–117)
BUN: 11 mg/dL (ref 6–23)
CO2: 25 mEq/L (ref 19–32)
Calcium: 8.8 mg/dL (ref 8.4–10.5)
Chloride: 107 mEq/L (ref 96–112)
Creatinine, Ser: 0.66 mg/dL (ref 0.40–1.20)
GFR: 119.88 mL/min (ref >60.00)
Glucose, Bld: 98 mg/dL (ref 70–99)
Potassium: 4.2 mEq/L (ref 3.5–5.1)
Sodium: 138 mEq/L (ref 135–145)
Total Bilirubin: 0.3 mg/dL (ref 0.2–1.2)
Total Protein: 6.4 g/dL (ref 6.0–8.3)

## 2021-08-26 LAB — T4, FREE: Free T4: 0.63 ng/dL (ref 0.60–1.60)

## 2021-08-26 LAB — TSH: TSH: 2.41 u[IU]/mL (ref 0.35–5.50)

## 2021-08-28 ENCOUNTER — Other Ambulatory Visit (HOSPITAL_COMMUNITY): Payer: Self-pay

## 2021-08-28 MED ORDER — CONCERTA 27 MG PO TBCR
27.0000 mg | EXTENDED_RELEASE_TABLET | Freq: Every morning | ORAL | 0 refills | Status: DC
  Filled 2021-08-28: qty 30, 30d supply, fill #0

## 2021-08-28 MED ORDER — PRAZOSIN HCL 2 MG PO CAPS
2.0000 mg | ORAL_CAPSULE | Freq: Every evening | ORAL | 1 refills | Status: DC
  Filled 2021-08-28: qty 60, 60d supply, fill #0
  Filled 2021-10-05: qty 30, 30d supply, fill #1

## 2021-08-28 MED ORDER — ESCITALOPRAM OXALATE 10 MG PO TABS
15.0000 mg | ORAL_TABLET | Freq: Every day | ORAL | 1 refills | Status: DC
  Filled 2021-08-28: qty 45, 30d supply, fill #0
  Filled 2021-10-05: qty 45, 30d supply, fill #1

## 2021-09-01 ENCOUNTER — Other Ambulatory Visit (HOSPITAL_COMMUNITY): Payer: Self-pay

## 2021-09-03 ENCOUNTER — Other Ambulatory Visit (HOSPITAL_COMMUNITY): Payer: Self-pay

## 2021-09-08 ENCOUNTER — Ambulatory Visit (INDEPENDENT_AMBULATORY_CARE_PROVIDER_SITE_OTHER): Payer: No Typology Code available for payment source | Admitting: Psychology

## 2021-09-08 DIAGNOSIS — F4323 Adjustment disorder with mixed anxiety and depressed mood: Secondary | ICD-10-CM

## 2021-09-29 ENCOUNTER — Ambulatory Visit (INDEPENDENT_AMBULATORY_CARE_PROVIDER_SITE_OTHER): Payer: No Typology Code available for payment source | Admitting: Psychology

## 2021-09-29 DIAGNOSIS — F4323 Adjustment disorder with mixed anxiety and depressed mood: Secondary | ICD-10-CM

## 2021-10-05 ENCOUNTER — Other Ambulatory Visit (HOSPITAL_COMMUNITY): Payer: Self-pay

## 2021-10-06 ENCOUNTER — Other Ambulatory Visit (HOSPITAL_COMMUNITY): Payer: Self-pay

## 2021-10-07 ENCOUNTER — Ambulatory Visit (INDEPENDENT_AMBULATORY_CARE_PROVIDER_SITE_OTHER): Payer: No Typology Code available for payment source | Admitting: Family Medicine

## 2021-10-07 ENCOUNTER — Other Ambulatory Visit (HOSPITAL_COMMUNITY): Payer: Self-pay

## 2021-10-07 ENCOUNTER — Other Ambulatory Visit: Payer: Self-pay

## 2021-10-07 ENCOUNTER — Encounter: Payer: Self-pay | Admitting: Family Medicine

## 2021-10-07 VITALS — BP 120/78 | HR 90 | Temp 97.1°F | Ht 65.95 in | Wt 202.8 lb

## 2021-10-07 DIAGNOSIS — Z720 Tobacco use: Secondary | ICD-10-CM | POA: Diagnosis not present

## 2021-10-07 DIAGNOSIS — L680 Hirsutism: Secondary | ICD-10-CM

## 2021-10-07 DIAGNOSIS — F909 Attention-deficit hyperactivity disorder, unspecified type: Secondary | ICD-10-CM | POA: Diagnosis not present

## 2021-10-07 DIAGNOSIS — E669 Obesity, unspecified: Secondary | ICD-10-CM

## 2021-10-07 DIAGNOSIS — F418 Other specified anxiety disorders: Secondary | ICD-10-CM | POA: Diagnosis not present

## 2021-10-07 DIAGNOSIS — F902 Attention-deficit hyperactivity disorder, combined type: Secondary | ICD-10-CM | POA: Insufficient documentation

## 2021-10-07 DIAGNOSIS — N946 Dysmenorrhea, unspecified: Secondary | ICD-10-CM | POA: Diagnosis not present

## 2021-10-07 NOTE — Progress Notes (Signed)
[facility] [address] [place] [address] Dept: [phone] Dept Fax: [phone]  Transfer of Care Office Visit  Subjective:    Patient ID: Brittany Frazier, female    DOB: [birth year], 30 y.o..   MRN: [id number]  Chief Complaint  Patient presents with   Establish Care    TOC. No other concerns     History of Present Illness:  Patient is in today to establish care. [name] was bron in [place], [place]. Her family was back and forth between the [place] and [place] during her early years, but had settled down here by her school years. 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 She works in the [employer]. at [employer]. She is single, having been through a difficult break-up earlier this year. She has no children. She smokes about 1 pack of cigarettes per week. She had previously been smoking about twice as much. She was able to stop for a time int he past and had used nicotine patches, but then relapsed. She remains interested in stopping. [name] drinks rarely now, noting she had been binge drinking in the past and felt it had become a problem. She denies drug use, though she uses CBD occasionally to help with sleep.  [name] has a history of acne. She has been managed by Dr. Jorja Loa, primarily using Retin-A. She also has a history of hirsutism and an elevated testosterone level. She is seen by Dr. Lonzo Cloud (endocrinology). She is managed on spironolactone, which also can help her acne. She notes there have been past concerns that she might have PCOS. She does tend to get heavy, painful periods. She is back on a COC, which regulates her period, but has not stopped her heaviness of flow.   [name] has a history of depression with an occasional panic attack. She is Brittany Frazier with [facility]. He increased her Lexapro to 15 mg, which  seems to be helping. Some of this was triggered by her break-up. She feels more positive now about how things are going. As well, she has a past history of ADHD. He now has her on Concerta. She is not sure that this is helping as much as Adderall did in the past. Additionally, she is on prazosin to help with nightmares. She has slowly been escalating her dose.   [name] is concerned about obesity.  Her weight is up about 64 lbs. since May. She feels like she exercises regularly and eats appropriate portions.  Past Medical History: Patient Active Problem List   Diagnosis Date Noted   Tobacco use 10/07/2021   Attention deficit hyperactivity disorder (ADHD) 10/07/2021   Acne vulgaris 07/14/2021   Depression with anxiety 07/14/2021   BMI 32.0-32.9,adult 07/14/2021   Elevated testosterone level 11/16/2019   Hirsutism 11/16/2019   Past Surgical History:  Procedure Laterality Date   HERNIA REPAIR Bilateral    Family History  Problem Relation Age of Onset   Thyroid disease Mother    Stroke Maternal Uncle    Cancer Paternal Uncle        Colon   Heart disease Maternal Grandmother    Stroke Maternal Grandfather    Diabetes Maternal Grandfather    Heart disease Maternal Grandfather    Diabetes Paternal Grandfather    Stroke Paternal Grandfather    Outpatient Medications Prior to Visit  Medication Sig Dispense Refill  CONCERTA 27 MG CR tablet Take 1 tablet (27 mg total) by mouth every morning. 30 tablet 0   escitalopram (LEXAPRO) 10 MG tablet Take 1&1/2 tablets (15 mg total) by mouth daily. 45 tablet 1   norethindrone-ethinyl estradiol-FE (LOESTRIN FE) 1-20 MG-MCG tablet TAKE 1 TABLET BY MOUTH ONCE DAILY 84 tablet 3   prazosin (MINIPRESS) 2 MG capsule Take 1 capsule (2 mg total) by mouth at bedtime. 60 capsule 1   spironolactone (ALDACTONE) 100 MG tablet Take 1 tablet by mouth 2 (two) times daily. 180 tablet 3   tretinoin (RETIN-A) 0.025 % cream APPLY TO THE AFFECTED AREA(S) AT BEDTIME  45 g 2   escitalopram (LEXAPRO) 10 MG tablet Take 1 tablet by mouth daily (Patient not taking: Reported on 10/07/2021) 30 tablet 1   prazosin (MINIPRESS) 1 MG capsule Take 1 capsule by mouth at night (Patient not taking: Reported on 10/07/2021) 30 capsule 0   No facility-administered medications prior to visit.    Allergies  Allergen Reactions   Benzoyl Peroxide Rash   Sulfa Antibiotics Rash    Objective:   Today's Vitals   10/07/21 1402  BP: 120/78  Pulse: 90  Temp: (!) 97.1 F (36.2 C)  SpO2: 97%  Weight: 202 lb 12.8 oz (92 kg)  Height: 5' 5.95" (1.675 m)   Body mass index is 32.79 kg/m.   General: Well developed, well nourished. No acute distress. Psych: Alert and oriented. Normal mood and affect.  Health Maintenance Due  Topic Date Due   Pneumococcal Vaccine 29-93 Years old (1 - PCV) Never done   Hepatitis C Screening  Never done   PAP-Cervical Cytology Screening  Never done   PAP SMEAR-Modifier  04/21/2019   COVID-19 Vaccine (4 - Booster for Pfizer series) 11/28/2020     Lab Results: Lab Results  Component Value Date   TSH 2.41 08/25/2021   CMP Latest Ref Rng & Units 08/25/2021 01/08/2021 06/20/2020  Glucose 70 - 99 mg/dL 98 176(H) 93  BUN 6 - 23 mg/dL 11 9 8   Creatinine 0.40 - 1.20 mg/dL 6.07 3.71  Sodium 135 - 145 mEq/L 138 136 136  Potassium 3.5 - 5.1 mEq/L 4.2 3.5 3.6  Chloride 96 - 112 mEq/L 107 100 101  CO2 19 - 32 mEq/L 25 25 28   Calcium 8.4 - 10.5 mg/dL 8.8 0.62) 9.4  Total Protein 6.0 - 8.3 g/dL 6.4 6.7 7.1  Total Bilirubin 0.2 - 1.2 mg/dL 0.3 ) 0.3  Alkaline Phos 39 - 117 U/L 59 46 52  AST 0 - 37 U/L 16 23 25   ALT 0 - 35 U/L 14 19 21    Assessment & Plan:   1. Hirsutism 2. Dysmenorrhea Continue spironolactone for hirsuitism. Because of the concern for possible PCOS and her heavy menses, she would like to see a GYN for women's health. She is also due for her routine pap smear.  - Ambulatory referral to Gynecology  3. Depression  with anxiety Improved on Lexapro. She will continue to follow with [facility]  4. Attention deficit hyperactivity disorder (ADHD), unspecified ADHD type Currently on Concerta managed by [facility]/  5. Obesity (BMI 30.0-34.9) Significant weight gain in last 7 months. I will [facility] to [facility].  - Amb Ref to Medical Weight Management  6. Tobacco use We discussed tobacco use. I recommended she quit. As she is on a low number of cigarettes a day, she likely does not have a significant nicotine dependence and nicotine replacement would  not be indicated. I spent 4 minutes counseling Ms. Zamora on tobacco cessation.  Loyola Mast, MD

## 2021-10-08 ENCOUNTER — Telehealth: Payer: Self-pay

## 2021-10-08 NOTE — Telephone Encounter (Signed)
[facility] referring for Hirsutism, Dysmenorrhea. Contacted patient via phone. Voicemail is full unable to leave message.

## 2021-10-09 ENCOUNTER — Other Ambulatory Visit (HOSPITAL_COMMUNITY): Payer: Self-pay

## 2021-10-09 NOTE — Telephone Encounter (Signed)
Voicemail is full unable to leave message

## 2021-10-12 ENCOUNTER — Other Ambulatory Visit (HOSPITAL_COMMUNITY): Payer: Self-pay

## 2021-10-14 ENCOUNTER — Other Ambulatory Visit (HOSPITAL_COMMUNITY): Payer: Self-pay

## 2021-10-14 MED ORDER — PRAZOSIN HCL 1 MG PO CAPS
ORAL_CAPSULE | ORAL | 1 refills | Status: AC
  Filled 2021-10-14: qty 30, 30d supply, fill #0

## 2021-10-14 MED ORDER — METHYLPHENIDATE HCL ER (OSM) 36 MG PO TBCR
EXTENDED_RELEASE_TABLET | ORAL | 0 refills | Status: DC
  Filled 2021-10-14: qty 30, 30d supply, fill #0

## 2021-10-15 ENCOUNTER — Other Ambulatory Visit (HOSPITAL_COMMUNITY): Payer: Self-pay

## 2021-10-16 ENCOUNTER — Other Ambulatory Visit (HOSPITAL_COMMUNITY): Payer: Self-pay

## 2021-10-16 NOTE — Telephone Encounter (Signed)
Voicemail is full unable to leave a message. Contacted referring provider about multiple attempts to reach patient were unsuccessful

## 2021-10-19 ENCOUNTER — Other Ambulatory Visit (HOSPITAL_COMMUNITY): Payer: Self-pay

## 2021-10-19 MED ORDER — PRAZOSIN HCL 2 MG PO CAPS
2.0000 mg | ORAL_CAPSULE | Freq: Every evening | ORAL | 1 refills | Status: DC
  Filled 2021-10-19: qty 60, 60d supply, fill #0

## 2021-10-19 MED ORDER — CONCERTA 36 MG PO TBCR: EXTENDED_RELEASE_TABLET | ORAL | 0 refills | Status: DC

## 2021-10-19 MED ORDER — ESCITALOPRAM OXALATE 10 MG PO TABS
ORAL_TABLET | ORAL | 1 refills | Status: DC
  Filled 2021-10-19: qty 45, 30d supply, fill #0

## 2021-10-19 MED ORDER — PRAZOSIN HCL 1 MG PO CAPS
ORAL_CAPSULE | ORAL | 1 refills | Status: DC
  Filled 2021-10-19: qty 30, 30d supply, fill #0

## 2021-10-20 ENCOUNTER — Other Ambulatory Visit (HOSPITAL_COMMUNITY): Payer: Self-pay

## 2021-10-20 ENCOUNTER — Ambulatory Visit: Payer: No Typology Code available for payment source | Admitting: Psychology

## 2021-10-21 ENCOUNTER — Ambulatory Visit: Payer: No Typology Code available for payment source | Admitting: Dermatology

## 2021-10-28 ENCOUNTER — Other Ambulatory Visit (HOSPITAL_COMMUNITY): Payer: Self-pay

## 2021-10-29 ENCOUNTER — Other Ambulatory Visit (HOSPITAL_COMMUNITY): Payer: Self-pay

## 2021-10-29 MED ORDER — CONCERTA 36 MG PO TBCR: 36.0000 mg | EXTENDED_RELEASE_TABLET | Freq: Every day | ORAL | 0 refills | Status: DC

## 2021-11-03 ENCOUNTER — Ambulatory Visit (INDEPENDENT_AMBULATORY_CARE_PROVIDER_SITE_OTHER): Payer: Self-pay | Admitting: Psychology

## 2021-11-03 DIAGNOSIS — F4323 Adjustment disorder with mixed anxiety and depressed mood: Secondary | ICD-10-CM

## 2021-11-03 NOTE — Progress Notes (Signed)
[facility] Counselor/Therapist Progress Note  Patient ID: Brittany Frazier, MRN: [id number],    Date: 11/03/2021  Time Spent: 45 mins  Treatment Type: Individual Therapy  Reported Symptoms: Pt presented for our follow up session, via webex video, due to virus outbreak.  Pt granted consent for session, stating that she was in her car with no one else present.  I shared with pt that I am in my office at home with no one else present here.  Pt shares her mom's birthday was [birth year]; her mom got COVID on her birthday and then got the flu and has recovered from both.  Her dad also had the flu and is recovered.  Pt's birthday was [birth year]; they are celebrating this Saturday, because her parents were sick.  Pt worked a couple of hours on Christmas and worked all of [employer].    Mental Status Exam: Appearance:  Casual     Behavior: Appropriate  Motor: Normal  Speech/Language:  Clear and Coherent  Affect: Appropriate  Mood: normal  Thought process: normal  Thought content:   WNL  Sensory/Perceptual disturbances:   WNL  Orientation: oriented to person, place, and time/date  Attention: Good  Concentration: Good  Memory: WNL  Fund of knowledge:  Good  Insight:   Good  Judgment:  Good  Impulse Control: Good   Risk Assessment: Danger to Self:  No Self-injurious Behavior: No Danger to Others: No Duty to Warn:no Physical Aggression / Violence:No  Access to Firearms a concern: No  Gang Involvement:No   Subjective: Pt shares that there was an incident at work with a female coworker on Sunday night.  Pt went to the bathroom at 4am and saw a female coworker lying face down on the floor of the bathroom with his pants down (door was not locked).  Pt was upset by seeing him there.  She shares that she used her deep breathing technique to "breathe my way through the anxiety of the situation."  He approached her later in the shift saying that he was putting a suppository in and he thought  he had locked the door.  Pt shares she does not want to continue working with this person.  She also shares someone found this same female employee asleep during his shift and apparently nothing happened to him.  Pt is planning to apply for an open position at [employer]; she is considering returning to a weeknight shift schedule.  Pt shares she continues to walk [place] when the weather cooperates.  She has not been able to go to [employer], due to caring for her parents during their illnesses.  Encouraged pt to reconnect with her self care activities as much as she can and we will meet in one month for a follow up session.  Interventions: Cognitive Behavioral Therapy  Diagnosis:Adjustment disorder with mixed anxiety and depressed mood  Plan: Treatment Plan Strengths/Abilities:  Intelligent, Intuitive, Willing to participate in therapy Treatment Preferences:  Outpatient Individual Therapy Statement of Needs:  Patient is to use CBT, mindfulness and coping skills to help manage and/or decrease symptoms associated with their diagnosis. Symptoms:  Depressed/Irritable mood, worry, social withdrawal Problems Addressed:  Depressive thoughts, Sadness, Sleep issues, etc. Long Term Goals:  Pt to reduce overall level, frequency, and intensity of the feelings of depression/anxiety as evidenced by decreased irritability, negative self talk, and helpless feelings from 6 to 7 days/week to 0 to 1 days/week, per client report, for at least 3 consecutive months.  Progress: 30% Short  Term Goals:  Pt to verbally express understanding of the relationship between feelings of depression/anxiety and their impact on thinking patterns and behaviors.  Pt to verbalize an understanding of the role that distorted thinking plays in creating fears, excessive worry, and ruminations.  Progress: 30% Target Date:  11/03/2022 Frequency:  Bi-weekly Modality:  Cognitive Behavioral Therapy Interventions by Therapist:  Therapist will use CBT, Mindfulness  exercises, Coping skills and Referrals, as needed by client. Client has verbally approved this treatment plan.  Karie Kirks, [facility]

## 2021-11-09 ENCOUNTER — Telehealth: Payer: Self-pay | Admitting: Family

## 2021-11-09 DIAGNOSIS — H9209 Otalgia, unspecified ear: Secondary | ICD-10-CM

## 2021-11-09 DIAGNOSIS — U071 COVID-19: Secondary | ICD-10-CM

## 2021-11-09 MED ORDER — NAPROXEN 500 MG PO TABS
500.0000 mg | ORAL_TABLET | Freq: Two times a day (BID) | ORAL | 0 refills | Status: AC
Start: 2021-11-09 — End: ?

## 2021-11-09 MED ORDER — AMOXICILLIN-POT CLAVULANATE 875-125 MG PO TABS: 1.0000 | ORAL_TABLET | Freq: Two times a day (BID) | ORAL | 0 refills | Status: DC

## 2021-11-09 MED ORDER — FLUTICASONE PROPIONATE 50 MCG/ACT NA SUSP: 2.0000 | Freq: Every day | NASAL | 6 refills | Status: DC

## 2021-11-09 NOTE — Progress Notes (Signed)
E-Visit  for Positive Covid Test Result  We are sorry you are not feeling well. We are here to help!  You have tested positive for COVID-19, meaning that you were infected with the novel coronavirus and could give the virus to others.  It is vitally important that you stay home so you do not spread it to others.      Please continue isolation at home, for at least 10 days since the start of your symptoms and until you have had 24 hours with no fever (without taking a fever reducer) and with improving of symptoms.  If you have no symptoms but tested positive (or all symptoms resolve after 5 days and you have no fever) you can leave your house but continue to wear a mask around others for an additional 5 days. If you have a fever,continue to stay home until you have had 24 hours of no fever. Most cases improve 5-10 days from onset but we have seen a small number of patients who have gotten worse after the 10 days.  Please be sure to watch for worsening symptoms and remain taking the proper precautions.   Go to the nearest hospital ED for assessment if fever/cough/breathlessness are severe or illness seems like a threat to life.    The following symptoms may appear 2-14 days after exposure: Fever Cough Shortness of breath or difficulty breathing Chills Repeated shaking with chills Muscle pain Headache Sore throat New loss of taste or smell Fatigue Congestion or runny nose Nausea or vomiting Diarrhea  You have been enrolled in MyChart Home Monitoring for COVID-19. Daily you will receive a questionnaire within the MyChart website. Our COVID-19 response team will be monitoring your responses daily.  You can use medication such as prescription anti-inflammatory called Naprosyn 500 mg. Take twice daily as needed for fever or body aches for 2 weeks and prescription for Fluticasone nasal spray 2 sprays in each nostril one time per day.   I have also sent over Augmentin for your ear pain. You  will take this twice a day for 7 days.   You may also take acetaminophen (Tylenol) as needed for fever.  HOME CARE: Only take medications as instructed by your medical team. Drink plenty of fluids and get plenty of rest. A steam or ultrasonic humidifier can help if you have congestion.   GET HELP RIGHT AWAY IF YOU HAVE EMERGENCY WARNING SIGNS.  Call 911 or proceed to your closest emergency facility if: You develop worsening high fever. Trouble breathing Bluish lips or face Persistent pain or pressure in the chest New confusion Inability to wake or stay awake You cough up blood. Your symptoms become more severe Inability to hold down food or fluids  This list is not all possible symptoms. Contact your medical provider for any symptoms that are severe or concerning to you.    Your e-visit answers were reviewed by a board certified advanced clinical practitioner to complete your personal care plan.  Depending on the condition, your plan could have included both over the counter or prescription medications.  If there is a problem please reply once you have received a response from your provider.  Your safety is important to [place].  If you have drug allergies check your prescription carefully.    You can use MyChart to ask questions about today's visit, request a non-urgent call back, or ask for a work or school excuse for 24 hours related to this e-Visit. If it has  been greater than 24 hours you will need to follow up with your provider, or enter a new e-Visit to address those concerns. You will get an e-mail in the next two days asking about your experience.  I hope that your e-visit has been valuable and will speed your recovery. Thank you for using e-visits.   Approximately 5 minutes was spent documenting and reviewing patient's chart.

## 2021-11-10 ENCOUNTER — Ambulatory Visit: Payer: No Typology Code available for payment source | Admitting: Dermatology

## 2021-11-17 ENCOUNTER — Other Ambulatory Visit (HOSPITAL_COMMUNITY): Payer: Self-pay

## 2021-11-17 MED ORDER — ESCITALOPRAM OXALATE 20 MG PO TABS
ORAL_TABLET | Freq: Every day | ORAL | 1 refills | Status: DC
  Filled 2021-11-17: qty 30, 30d supply, fill #0

## 2021-11-17 MED ORDER — AMPHETAMINE-DEXTROAMPHET ER 15 MG PO CP24
ORAL_CAPSULE | ORAL | 0 refills | Status: DC
Start: 2021-11-17 — End: 2022-07-09
  Filled 2021-11-17: qty 30, 30d supply, fill #0

## 2021-12-08 ENCOUNTER — Ambulatory Visit: Payer: Self-pay | Admitting: Psychology

## 2021-12-14 ENCOUNTER — Other Ambulatory Visit (HOSPITAL_COMMUNITY): Payer: Self-pay

## 2021-12-14 MED ORDER — AMPHETAMINE-DEXTROAMPHET ER 20 MG PO CP24
ORAL_CAPSULE | ORAL | 0 refills | Status: DC
  Filled 2021-12-14: qty 30, 30d supply, fill #0

## 2021-12-14 MED ORDER — PRAZOSIN HCL 1 MG PO CAPS
ORAL_CAPSULE | ORAL | 1 refills | Status: DC
  Filled 2021-12-14: qty 30, 30d supply, fill #0

## 2021-12-14 MED ORDER — PRAZOSIN HCL 2 MG PO CAPS
ORAL_CAPSULE | ORAL | 1 refills | Status: DC
  Filled 2021-12-14: qty 60, 60d supply, fill #0

## 2021-12-14 MED ORDER — ESCITALOPRAM OXALATE 20 MG PO TABS
ORAL_TABLET | Freq: Every day | ORAL | 1 refills | Status: DC
  Filled 2021-12-14: qty 30, 30d supply, fill #0

## 2021-12-16 ENCOUNTER — Other Ambulatory Visit: Payer: Self-pay | Admitting: Dermatology

## 2021-12-16 ENCOUNTER — Other Ambulatory Visit (HOSPITAL_COMMUNITY): Payer: Self-pay

## 2022-01-11 ENCOUNTER — Other Ambulatory Visit (HOSPITAL_COMMUNITY): Payer: Self-pay

## 2022-01-11 DIAGNOSIS — F411 Generalized anxiety disorder: Secondary | ICD-10-CM | POA: Diagnosis not present

## 2022-01-11 DIAGNOSIS — F4310 Post-traumatic stress disorder, unspecified: Secondary | ICD-10-CM | POA: Diagnosis not present

## 2022-01-11 DIAGNOSIS — F339 Major depressive disorder, recurrent, unspecified: Secondary | ICD-10-CM | POA: Diagnosis not present

## 2022-01-11 DIAGNOSIS — F902 Attention-deficit hyperactivity disorder, combined type: Secondary | ICD-10-CM | POA: Diagnosis not present

## 2022-01-11 MED ORDER — ADZENYS XR-ODT 12.5 MG PO TBED
EXTENDED_RELEASE_TABLET | ORAL | 0 refills | Status: DC
  Filled 2022-01-13: qty 30, 30d supply, fill #0

## 2022-01-11 MED ORDER — ESCITALOPRAM OXALATE 20 MG PO TABS
ORAL_TABLET | Freq: Every day | ORAL | 2 refills | Status: DC
  Filled 2022-01-11: qty 30, 30d supply, fill #0
  Filled 2022-03-08: qty 30, 30d supply, fill #1

## 2022-01-11 MED ORDER — PRAZOSIN HCL 2 MG PO CAPS
ORAL_CAPSULE | ORAL | 2 refills | Status: DC
  Filled 2022-01-11: qty 60, 30d supply, fill #0

## 2022-01-11 MED ORDER — PRAZOSIN HCL 1 MG PO CAPS
ORAL_CAPSULE | ORAL | 2 refills | Status: DC
  Filled 2022-01-11: qty 90, 30d supply, fill #0

## 2022-01-13 ENCOUNTER — Other Ambulatory Visit (HOSPITAL_COMMUNITY): Payer: Self-pay

## 2022-01-14 ENCOUNTER — Other Ambulatory Visit (HOSPITAL_COMMUNITY): Payer: Self-pay

## 2022-02-09 ENCOUNTER — Encounter: Payer: Self-pay | Admitting: Family Medicine

## 2022-02-09 DIAGNOSIS — F4310 Post-traumatic stress disorder, unspecified: Secondary | ICD-10-CM | POA: Insufficient documentation

## 2022-02-09 DIAGNOSIS — F339 Major depressive disorder, recurrent, unspecified: Secondary | ICD-10-CM | POA: Insufficient documentation

## 2022-02-11 DIAGNOSIS — F902 Attention-deficit hyperactivity disorder, combined type: Secondary | ICD-10-CM | POA: Diagnosis not present

## 2022-02-11 DIAGNOSIS — F4310 Post-traumatic stress disorder, unspecified: Secondary | ICD-10-CM | POA: Diagnosis not present

## 2022-02-11 DIAGNOSIS — F411 Generalized anxiety disorder: Secondary | ICD-10-CM | POA: Diagnosis not present

## 2022-02-11 DIAGNOSIS — F339 Major depressive disorder, recurrent, unspecified: Secondary | ICD-10-CM | POA: Diagnosis not present

## 2022-02-19 ENCOUNTER — Other Ambulatory Visit (HOSPITAL_COMMUNITY): Payer: Self-pay

## 2022-02-19 MED ORDER — ADZENYS XR-ODT 15.7 MG PO TBED
EXTENDED_RELEASE_TABLET | ORAL | 0 refills | Status: DC
  Filled 2022-02-19: qty 30, 30d supply, fill #0

## 2022-02-22 ENCOUNTER — Other Ambulatory Visit (HOSPITAL_COMMUNITY): Payer: Self-pay

## 2022-03-08 ENCOUNTER — Other Ambulatory Visit (HOSPITAL_COMMUNITY): Payer: Self-pay

## 2022-03-08 DIAGNOSIS — F902 Attention-deficit hyperactivity disorder, combined type: Secondary | ICD-10-CM | POA: Diagnosis not present

## 2022-03-08 DIAGNOSIS — F518 Other sleep disorders not due to a substance or known physiological condition: Secondary | ICD-10-CM | POA: Diagnosis not present

## 2022-03-08 DIAGNOSIS — F4310 Post-traumatic stress disorder, unspecified: Secondary | ICD-10-CM | POA: Diagnosis not present

## 2022-03-08 DIAGNOSIS — F411 Generalized anxiety disorder: Secondary | ICD-10-CM | POA: Diagnosis not present

## 2022-03-12 ENCOUNTER — Other Ambulatory Visit (HOSPITAL_COMMUNITY): Payer: Self-pay

## 2022-03-12 MED ORDER — ADZENYS XR-ODT 15.7 MG PO TBED
EXTENDED_RELEASE_TABLET | ORAL | 0 refills | Status: DC
  Filled 2022-03-12 – 2022-03-22 (×2): qty 30, 30d supply, fill #0

## 2022-03-12 MED ORDER — CLONIDINE HCL ER 0.1 MG PO TB12
ORAL_TABLET | ORAL | 2 refills | Status: DC
  Filled 2022-03-12: qty 60, 30d supply, fill #0

## 2022-03-12 MED ORDER — ESCITALOPRAM OXALATE 20 MG PO TABS
ORAL_TABLET | Freq: Every day | ORAL | 0 refills | Status: DC
Start: 2022-03-12 — End: 2022-07-09
  Filled 2022-03-12: qty 90, 90d supply, fill #0

## 2022-03-16 ENCOUNTER — Encounter (HOSPITAL_COMMUNITY): Payer: Self-pay | Admitting: Pharmacist

## 2022-03-16 ENCOUNTER — Other Ambulatory Visit (HOSPITAL_COMMUNITY): Payer: Self-pay

## 2022-03-22 ENCOUNTER — Other Ambulatory Visit (HOSPITAL_COMMUNITY): Payer: Self-pay

## 2022-04-01 ENCOUNTER — Other Ambulatory Visit (HOSPITAL_COMMUNITY): Payer: Self-pay

## 2022-04-29 DIAGNOSIS — F339 Major depressive disorder, recurrent, unspecified: Secondary | ICD-10-CM | POA: Diagnosis not present

## 2022-04-29 DIAGNOSIS — F411 Generalized anxiety disorder: Secondary | ICD-10-CM | POA: Diagnosis not present

## 2022-04-29 DIAGNOSIS — F4310 Post-traumatic stress disorder, unspecified: Secondary | ICD-10-CM | POA: Diagnosis not present

## 2022-04-29 DIAGNOSIS — F902 Attention-deficit hyperactivity disorder, combined type: Secondary | ICD-10-CM | POA: Diagnosis not present

## 2022-05-05 ENCOUNTER — Other Ambulatory Visit (HOSPITAL_COMMUNITY): Payer: Self-pay

## 2022-05-05 MED ORDER — AMPHETAMINE-DEXTROAMPHET ER 30 MG PO CP24
ORAL_CAPSULE | ORAL | 0 refills | Status: DC
  Filled 2022-05-05: qty 30, 30d supply, fill #0

## 2022-05-05 MED ORDER — AMPHETAMINE-DEXTROAMPHETAMINE 15 MG PO TABS
ORAL_TABLET | ORAL | 0 refills | Status: DC
  Filled 2022-05-05: qty 30, 30d supply, fill #0

## 2022-05-31 ENCOUNTER — Other Ambulatory Visit (HOSPITAL_COMMUNITY): Payer: Self-pay

## 2022-06-04 DIAGNOSIS — F4310 Post-traumatic stress disorder, unspecified: Secondary | ICD-10-CM | POA: Diagnosis not present

## 2022-06-04 DIAGNOSIS — F902 Attention-deficit hyperactivity disorder, combined type: Secondary | ICD-10-CM | POA: Diagnosis not present

## 2022-06-04 DIAGNOSIS — F339 Major depressive disorder, recurrent, unspecified: Secondary | ICD-10-CM | POA: Diagnosis not present

## 2022-06-04 DIAGNOSIS — F411 Generalized anxiety disorder: Secondary | ICD-10-CM | POA: Diagnosis not present

## 2022-06-10 ENCOUNTER — Other Ambulatory Visit (HOSPITAL_COMMUNITY): Payer: Self-pay

## 2022-06-10 MED ORDER — ESCITALOPRAM OXALATE 20 MG PO TABS
ORAL_TABLET | ORAL | 0 refills | Status: DC
  Filled 2022-06-10: qty 90, 90d supply, fill #0

## 2022-06-10 MED ORDER — PRAZOSIN HCL 1 MG PO CAPS
ORAL_CAPSULE | ORAL | 2 refills | Status: DC
  Filled 2022-06-10: qty 30, 10d supply, fill #0

## 2022-06-10 MED ORDER — ONDANSETRON 8 MG PO TBDP
ORAL_TABLET | ORAL | 2 refills | Status: AC
  Filled 2022-06-10: qty 30, 15d supply, fill #0
  Filled 2022-10-12 – 2022-10-13 (×2): qty 30, 15d supply, fill #1

## 2022-06-10 MED ORDER — CLONIDINE HCL ER 0.1 MG PO TB12
ORAL_TABLET | ORAL | 0 refills | Status: DC
  Filled 2022-06-10: qty 180, 90d supply, fill #0

## 2022-06-12 ENCOUNTER — Other Ambulatory Visit (HOSPITAL_COMMUNITY): Payer: Self-pay

## 2022-06-14 ENCOUNTER — Other Ambulatory Visit (HOSPITAL_COMMUNITY): Payer: Self-pay

## 2022-06-15 ENCOUNTER — Other Ambulatory Visit: Payer: Self-pay | Admitting: Family Medicine

## 2022-06-15 ENCOUNTER — Other Ambulatory Visit (HOSPITAL_COMMUNITY): Payer: Self-pay

## 2022-06-15 DIAGNOSIS — Z309 Encounter for contraceptive management, unspecified: Secondary | ICD-10-CM

## 2022-06-15 MED ORDER — AMPHETAMINE-DEXTROAMPHETAMINE 15 MG PO TABS
ORAL_TABLET | ORAL | 0 refills | Status: DC
  Filled 2022-06-15: qty 30, 30d supply, fill #0

## 2022-06-15 MED ORDER — AMPHETAMINE-DEXTROAMPHET ER 30 MG PO CP24
30.0000 mg | ORAL_CAPSULE | Freq: Every morning | ORAL | 0 refills | Status: DC
  Filled 2022-06-15: qty 30, 30d supply, fill #0

## 2022-06-15 MED ORDER — NORETHIN ACE-ETH ESTRAD-FE 1-20 MG-MCG PO TABS
1.0000 | ORAL_TABLET | Freq: Every day | ORAL | 0 refills | Status: DC
  Filled 2022-06-15: qty 84, 84d supply, fill #0

## 2022-06-16 ENCOUNTER — Other Ambulatory Visit (HOSPITAL_COMMUNITY): Payer: Self-pay

## 2022-06-17 ENCOUNTER — Other Ambulatory Visit (HOSPITAL_COMMUNITY): Payer: Self-pay

## 2022-06-25 ENCOUNTER — Encounter: Payer: Self-pay | Admitting: Family Medicine

## 2022-07-09 ENCOUNTER — Other Ambulatory Visit (HOSPITAL_COMMUNITY): Payer: Self-pay

## 2022-07-09 ENCOUNTER — Ambulatory Visit: Payer: Commercial Managed Care - PPO | Admitting: Family Medicine

## 2022-07-09 ENCOUNTER — Encounter: Payer: Self-pay | Admitting: Family Medicine

## 2022-07-09 VITALS — BP 122/76 | HR 67 | Temp 97.0°F | Ht 65.5 in | Wt 195.6 lb

## 2022-07-09 DIAGNOSIS — E6609 Other obesity due to excess calories: Secondary | ICD-10-CM | POA: Diagnosis not present

## 2022-07-09 DIAGNOSIS — R102 Pelvic and perineal pain: Secondary | ICD-10-CM | POA: Diagnosis not present

## 2022-07-09 DIAGNOSIS — L564 Polymorphous light eruption: Secondary | ICD-10-CM | POA: Diagnosis not present

## 2022-07-09 DIAGNOSIS — R399 Unspecified symptoms and signs involving the genitourinary system: Secondary | ICD-10-CM | POA: Diagnosis not present

## 2022-07-09 DIAGNOSIS — Z6832 Body mass index (BMI) 32.0-32.9, adult: Secondary | ICD-10-CM | POA: Diagnosis not present

## 2022-07-09 LAB — POCT URINALYSIS DIPSTICK
Glucose, UA: NEGATIVE
Nitrite, UA: NEGATIVE
Protein, UA: POSITIVE — AB
Spec Grav, UA: >=1.03 — AB (ref 1.010–1.025)
Urobilinogen, UA: 1 E.U./dL
pH, UA: 6 (ref 5.0–8.0)

## 2022-07-09 MED ORDER — WEGOVY 0.5 MG/0.5ML ~~LOC~~ SOAJ
0.5000 mg | SUBCUTANEOUS | 0 refills | Status: DC
  Filled 2022-07-09: qty 2, 28d supply, fill #0

## 2022-07-09 MED ORDER — WEGOVY 0.25 MG/0.5ML ~~LOC~~ SOAJ
0.2500 mg | SUBCUTANEOUS | 0 refills | Status: DC
  Filled 2022-07-09 – 2022-09-10 (×3): qty 2, 28d supply, fill #0

## 2022-07-09 NOTE — Progress Notes (Signed)
[facility] [address] [place] [address] Dept: [phone] Dept Fax: [phone]  Office Visit  Subjective:    Patient ID: Brittany Frazier, female    DOB: 1991/02/06, 31 y.o..   MRN: [id number]  Chief Complaint  Patient presents with   Weight Loss    Discuss weight loss injection. Patient mentioned lower back pain and lower abdominal bloating. She stated that her cycle is about to come on. She states she can leave a urine by the end of the visit.    History of Present Illness:  Patient is in today for a discussion regarding medication to assist with weight loss.  [name] notes that she has been doing much better over the past year. She feels her depression is much more controlled at this point.  She continues to struggle with her weight issues. When I saw her last Dec.,  She noted she had experienced a 64 lb. weight gain over a period of 7 months. She notes she has been taking a supplement containing tejocote, which she feels has helped some, but she is still struggling. She is interested in medication assistance for this.  She does admit to areas of rash that she experiences when she goes out in the sun. These create itchy patches on her arms. She does try and use sun block for this.   [name] also notes she has a history of chronic low back pain. More recently this has been more pronounced and associated with some pressure over her bladder area.  Past Medical History: Patient Active Problem List   Diagnosis Date Noted   Major depressive disorder, recurrent (HCC) 02/09/2022   Posttraumatic stress disorder 02/09/2022   Tobacco use 10/07/2021   Attention deficit hyperactivity disorder, combined type 10/07/2021   Acne vulgaris 07/14/2021   Generalized anxiety disorder 07/14/2021   Class 1 obesity due to excess calories with body mass index (BMI) of 32.0 to 32.9 in adult 07/14/2021   Elevated testosterone  level 11/16/2019   Hirsutism 11/16/2019   Past Surgical History:  Procedure Laterality Date   HERNIA REPAIR Bilateral    Family History  Problem Relation Age of Onset   Thyroid disease Mother    Stroke Maternal Uncle    Cancer Paternal Uncle        Colon   Heart disease Maternal Grandmother    Stroke Maternal Grandfather    Diabetes Maternal Grandfather    Heart disease Maternal Grandfather    Diabetes Paternal Grandfather    Stroke Paternal Grandfather    Outpatient Medications Prior to Visit  Medication Sig Dispense Refill   amphetamine-dextroamphetamine (ADDERALL XR) 30 MG 24 hr capsule Take 1 capsule (30 mg total) by mouth every morning. 30 capsule 0   amphetamine-dextroamphetamine (ADDERALL) 15 MG tablet Take 1/2-1 tablet by mouth in the early afternoon for breakthrough ADHD symptoms as needed 30 tablet 0   cloNIDine HCl (KAPVAY) 0.1 MG TB12 ER tablet Take 1 tablet by mouth every night at bedtime for 7 days, if tolerating, increase to 2 tablets by mouth every night at bedtime. 60 tablet 2   cloNIDine HCl (KAPVAY) 0.1 MG TB12 ER tablet Take 1-2 tablets by mouth every night for nightmares, flashbacks, and sleep. 180 tablet 0   escitalopram (LEXAPRO) 10 MG tablet Take 1 and 1/2 tablets by mouth daily 45 tablet 1   escitalopram (LEXAPRO) 20 MG tablet Take 1 tablet by mouth every morning. 90 tablet 0   norethindrone-ethinyl  estradiol-FE (BLISOVI FE 1/20) 1-20 MG-MCG tablet Take 1 tablet by mouth daily. 84 tablet 0   ondansetron (ZOFRAN-ODT) 8 MG disintegrating tablet Dissolve 1/2  to 1 tablet by mouth 2 times a day as needed for nausea 30 tablet 2   prazosin (MINIPRESS) 1 MG capsule Take 1 capsule at night (together with 2 mg for total of 3 mg) 30 capsule 1   spironolactone (ALDACTONE) 100 MG tablet Take 1 tablet by mouth 2 (two) times daily. 180 tablet 3   amoxicillin-clavulanate (AUGMENTIN) 875-125 MG tablet Take 1 tablet by mouth 2 (two) times daily. 14 tablet 0   Amphetamine ER  (ADZENYS XR-ODT) 12.5 MG TBED Dissolve 1 tablet by mouth daily 30 tablet 0   Amphetamine ER (ADZENYS XR-ODT) 15.7 MG TBED Dissolve 1 tablet by mouth every morning. 30 tablet 0   amphetamine-dextroamphetamine (ADDERALL XR) 15 MG 24 hr capsule Take 1 capsule by mouth daily in the morning (Patient not taking: Reported on 07/09/2022) 30 capsule 0   amphetamine-dextroamphetamine (ADDERALL XR) 20 MG 24 hr capsule Take 1 capsule by mouth in morning (Patient not taking: Reported on 07/09/2022) 30 capsule 0   amphetamine-dextroamphetamine (ADDERALL XR) 30 MG 24 hr capsule Take 1 capsule by mouth in the morning. (Patient not taking: Reported on 07/09/2022) 30 capsule 0   amphetamine-dextroamphetamine (ADDERALL) 15 MG tablet Take 1/2 to 1 tablet by mouth as needed early afternoon for breakthrough ADHD symptoms (Patient not taking: Reported on 07/09/2022) 30 tablet 0   CONCERTA 27 MG CR tablet Take 1 tablet (27 mg total) by mouth every morning. 30 tablet 0   CONCERTA 36 MG CR tablet Fill on 11/12/20 30 tablet 0   CONCERTA 36 MG CR tablet Take 1 tablet (36 mg total) by mouth daily. (11/12/21) 30 tablet 0   escitalopram (LEXAPRO) 20 MG tablet Take 1 tablet by mouth once daily (Patient not taking: Reported on 07/09/2022) 30 tablet 1   escitalopram (LEXAPRO) 20 MG tablet Take 1 tablet by mouth once a day (Patient not taking: Reported on 07/09/2022) 30 tablet 1   escitalopram (LEXAPRO) 20 MG tablet Take 1 tablet by mouth daily (Patient not taking: Reported on 07/09/2022) 30 tablet 2   escitalopram (LEXAPRO) 20 MG tablet Take one tablet by mouth daily. (Patient not taking: Reported on 07/09/2022) 90 tablet 0   fluticasone (FLONASE) 50 MCG/ACT nasal spray Place 2 sprays into both nostrils daily. 16 g 6   naproxen (NAPROSYN) 500 MG tablet Take 1 tablet (500 mg total) by mouth 2 (two) times daily with a meal. 30 tablet 0   prazosin (MINIPRESS) 1 MG capsule Take 1 capsule by mouth at night (together with 2 mg for total of 3 mg) (Patient not  taking: Reported on 07/09/2022) 30 capsule 1   prazosin (MINIPRESS) 1 MG capsule Take 1 capsule by mouth nightly (together with 2 mg for total of 3 mg) (Patient not taking: Reported on 07/09/2022) 30 capsule 1   prazosin (MINIPRESS) 1 MG capsule Take 1 capsule by mouth every 6 hours as needed for daytime hypervigilance, flashbacks, and irritability associated with trauma response (Patient not taking: Reported on 07/09/2022) 90 capsule 2   prazosin (MINIPRESS) 1 MG capsule Take 1 capsule by mouth up to 3 times a day as needed for daytime flashbacks, hypervigilance, and irritability (Patient not taking: Reported on 07/09/2022) 30 capsule 2   prazosin (MINIPRESS) 2 MG capsule Take 1 capsule (2 mg total) by mouth at bedtime. (Patient not taking: Reported on 07/09/2022)  60 capsule 1   prazosin (MINIPRESS) 2 MG capsule Take 1 capsule by mouth at bedtime (take with 1mg  capsule for total dose of 3mg ). (Patient not taking: Reported on 07/09/2022) 60 capsule 1   prazosin (MINIPRESS) 2 MG capsule Take 1 capsule by mouth at bedtime with 1 mg to equal 3 mg (Patient not taking: Reported on 07/09/2022) 60 capsule 1   prazosin (MINIPRESS) 2 MG capsule Take 1 to 2 capsules by mouth at bedtime for sleep/nightmares (Patient not taking: Reported on 07/09/2022) 60 capsule 2   No facility-administered medications prior to visit.   Allergies  Allergen Reactions   Benzoyl Peroxide Rash   Sulfa Antibiotics Rash     Objective:   Today's Vitals   07/09/22 1323  BP: 122/76  Pulse: 67  Temp: (!) 97 F (36.1 C)  TempSrc: Temporal  SpO2: 99%  Weight: 195 lb 9.6 oz (88.7 kg)  Height: 5' 5.5" (1.664 m)   Body mass index is 32.05 kg/m.   General: Well developed, well nourished. No acute distress. Skin: Warm and dry. Multiple patches of excoriated skin on sun exposed areas of the arms, L > R. Psych: Alert and oriented. Normal mood and affect.  Health Maintenance Due  Topic Date Due   Hepatitis C Screening  Never done    PAP-Cervical Cytology Screening  Never done   PAP SMEAR-Modifier  04/21/2019     Lab Results: Component Ref Range & Units 14:02  Color, UA  amber   Clarity, UA  cloudy   Glucose, UA Negative Negative   Bilirubin, UA  1+   Ketones, UA  postive   Spec Grav, UA 1.010 - 1.025 >=1.030 Abnormal    Blood, UA  2+   pH, UA 5.0 - 8.0 6.0   Protein, UA Negative Positive Abnormal    Urobilinogen, UA 0.2 or 1.0 E.U./dL 1.0   Nitrite, UA  neg   Leukocytes, UA Negative Trace Abnormal    Appearance  dark   Odor  yes    Assessment & Plan:   1. Class 1 obesity due to excess calories without serious comorbidity with body mass index (BMI) of 32.0 to 32.9 in adult Maximum weight: 202 lbs (10/2021) Current weight: 195 lbs Weight change since last visit: - 7 lbs  Ms. 04/23/2019 experienced a significant weight gain last year. She is having some success in losing weight, though I cannot advise on the safety or efficacy of tejocote. We discussed the importance of a lower calorie diet and regular exercise to achieve weight loss. We did discuss the potential adjunct of medicaiotn. In light of her history of anxiety and ADD, I would not consider her a candidate for phentermine. I will see if she can afford Wegovy. I will plan to follow up with her in 2 months.  - Semaglutide-Weight Management (WEGOVY) 0.25 MG/0.5ML SOAJ; Inject 0.25 mg into the skin once a week.  Dispense: 2 mL; Refill: 0 - Semaglutide-Weight Management (WEGOVY) 0.5 MG/0.5ML SOAJ; Inject 0.5 mg into the skin once a week. To start after 1 month on the 0.25 mg weekly dose.  Dispense: 2 mL; Refill: 0  2. Pelvic pressure in female Urine is equivocal. I will have her push fluids and send her urine for culture to determine if antibiotics are needed.  - POCT Urinalysis Dipstick - Urine Culture  3. 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 Discussed role for consistent use of sun blocks to reduce reactions.   Return in about  2 months (around 09/08/2022) for Reassessment.   Loyola Mast, MD

## 2022-07-10 LAB — URINE CULTURE
MICRO NUMBER:: 13891682
SPECIMEN QUALITY:: ADEQUATE

## 2022-07-14 ENCOUNTER — Other Ambulatory Visit (HOSPITAL_COMMUNITY): Payer: Self-pay

## 2022-07-14 MED ORDER — AMPHETAMINE-DEXTROAMPHET ER 30 MG PO CP24
30.0000 mg | ORAL_CAPSULE | Freq: Every morning | ORAL | 0 refills | Status: DC
  Filled 2022-07-14: qty 30, 30d supply, fill #0

## 2022-07-14 MED ORDER — ONDANSETRON 8 MG PO TBDP
ORAL_TABLET | ORAL | 0 refills | Status: DC
  Filled 2022-07-14: qty 30, 15d supply, fill #0

## 2022-07-14 MED ORDER — AMPHETAMINE-DEXTROAMPHETAMINE 15 MG PO TABS
ORAL_TABLET | ORAL | 0 refills | Status: DC
  Filled 2022-07-14: qty 30, 30d supply, fill #0

## 2022-07-15 ENCOUNTER — Other Ambulatory Visit (HOSPITAL_COMMUNITY): Payer: Self-pay

## 2022-07-17 ENCOUNTER — Other Ambulatory Visit (HOSPITAL_COMMUNITY): Payer: Self-pay

## 2022-07-20 ENCOUNTER — Other Ambulatory Visit (HOSPITAL_COMMUNITY): Payer: Self-pay

## 2022-07-22 ENCOUNTER — Other Ambulatory Visit (HOSPITAL_COMMUNITY): Payer: Self-pay

## 2022-07-23 ENCOUNTER — Other Ambulatory Visit (HOSPITAL_COMMUNITY): Payer: Self-pay

## 2022-08-02 ENCOUNTER — Other Ambulatory Visit (HOSPITAL_COMMUNITY): Payer: Self-pay

## 2022-08-02 DIAGNOSIS — F339 Major depressive disorder, recurrent, unspecified: Secondary | ICD-10-CM | POA: Diagnosis not present

## 2022-08-02 DIAGNOSIS — F4310 Post-traumatic stress disorder, unspecified: Secondary | ICD-10-CM | POA: Diagnosis not present

## 2022-08-02 DIAGNOSIS — F902 Attention-deficit hyperactivity disorder, combined type: Secondary | ICD-10-CM | POA: Diagnosis not present

## 2022-08-02 DIAGNOSIS — F411 Generalized anxiety disorder: Secondary | ICD-10-CM | POA: Diagnosis not present

## 2022-08-02 MED ORDER — CLONIDINE HCL ER 0.1 MG PO TB12
0.1000 mg | ORAL_TABLET | Freq: Every evening | ORAL | 0 refills | Status: AC | PRN
Start: 2022-08-02 — End: ?
  Filled 2022-08-02: qty 180, 90d supply, fill #0

## 2022-08-02 MED ORDER — ESCITALOPRAM OXALATE 20 MG PO TABS
20.0000 mg | ORAL_TABLET | Freq: Every morning | ORAL | 0 refills | Status: DC
  Filled 2022-08-02 – 2022-10-13 (×3): qty 90, 90d supply, fill #0

## 2022-08-02 MED ORDER — AMPHETAMINE-DEXTROAMPHET ER 30 MG PO CP24
30.0000 mg | ORAL_CAPSULE | Freq: Every morning | ORAL | 0 refills | Status: AC
  Filled 2022-08-13: qty 30, 30d supply, fill #0

## 2022-08-02 MED ORDER — ONDANSETRON 8 MG PO TBDP
ORAL_TABLET | ORAL | 0 refills | Status: DC
  Filled 2022-08-02: qty 30, 15d supply, fill #0

## 2022-08-02 MED ORDER — AMPHETAMINE-DEXTROAMPHETAMINE 15 MG PO TABS
7.5000 mg | ORAL_TABLET | ORAL | 0 refills | Status: AC | PRN
Start: 2022-08-02 — End: ?
  Filled 2022-08-13: qty 30, 30d supply, fill #0

## 2022-08-03 ENCOUNTER — Other Ambulatory Visit (HOSPITAL_COMMUNITY): Payer: Self-pay

## 2022-08-13 ENCOUNTER — Other Ambulatory Visit (HOSPITAL_COMMUNITY): Payer: Self-pay

## 2022-09-06 NOTE — Progress Notes (Deleted)
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 She has followed with [facility] since 05/11/2019 for consultative assistance with management of her Hirsutism.   HISTORICAL SUMMARY:  Pt was referred by Dr. Sindy Messing (Dermatolgoy ) for previously elevated  Testosterone. She has seen Dr. Talmage Nap in the past.  She was prescribed metformin at the time due to a diagnosis of pre-diabtes but was lost to follow up and did not continue to take it. She lost a lot of weight with lifestyle changes after college.   She has been  treated for severe cystic acne, and steatocytoma multiplex. She was on accutane and spironolactone 50 mg daily for~ 8 months through dermatology.   Menarche at 59 yrs of age with regular period  But with  dysmenorrhea  Has been  on Junel since 2018 Sprironolactone added 05/2019 SUBJECTIVE:    Today (09/06/2022):  [name] is here for a follow up on cystic acne, hx of elevated testosterone and hirsutism. She has been taking spironolactone without side effects  Has noted decrease in hair growth over the face  Denies dizziness, denies polydipsia  Had hx of edema   She is on topical trenoin through dermatology   She is concerned about weight gain   HISTORY:  Past Medical History:  Past Medical History:  Diagnosis Date   GERD (gastroesophageal reflux disease)    Past Surgical History:  Past Surgical History:  Procedure Laterality Date   HERNIA REPAIR Bilateral    Social History:  reports that she has been smoking cigarettes. She has never used smokeless  tobacco. She reports current alcohol use. She reports that she does not use drugs. Family History:  Family History  Problem Relation Age of Onset   Thyroid disease Mother    Stroke Maternal Uncle    Cancer Paternal Uncle        Colon   Heart disease Maternal Grandmother    Stroke Maternal Grandfather    Diabetes Maternal Grandfather    Heart disease Maternal Grandfather    Diabetes Paternal Grandfather    Stroke Paternal Grandfather      HOME MEDICATIONS: Allergies as of 09/07/2022       Reactions   Benzoyl Peroxide Rash   Sulfa Antibiotics Rash        Medication List        Accurate as of September 06, 2022  2:26 PM. If you have any questions, ask your nurse or doctor.          amoxicillin-clavulanate 875-125 MG tablet Commonly known as: AUGMENTIN Take 1 tablet by mouth 2 (two) times daily.   amphetamine-dextroamphetamine 15 MG tablet Commonly known as: ADDERALL Take 1/2-1 tablet by mouth as needed  early afternoon for breakthrough ADHD symptoms   amphetamine-dextroamphetamine 15 MG tablet Commonly known as: ADDERALL Take 0.5-1 tablets by mouth as needed in the early afternoon for breakthrough ADHD symptoms   Adderall XR 30 MG 24 hr capsule Generic drug: amphetamine-dextroamphetamine Take 1 capsule (30 mg total) by mouth every morning.   cloNIDine HCl 0.1 MG Tb12 ER  tablet Commonly known as: KAPVAY Take 1-2 tablets by mouth every night for nightmares, flashbacks, and sleep.   cloNIDine HCl 0.1 MG Tb12 ER tablet Commonly known as: KAPVAY Take 1-2 tablets (0.1-0.2 mg total) by mouth at bedtime as needed for nightmares, flashbacks, and sleep   escitalopram 20 MG tablet Commonly known as: LEXAPRO Take 1 tablet (20 mg total) by mouth every morning.   fluticasone 50 MCG/ACT nasal spray Commonly known as: FLONASE Place 2 sprays into both nostrils daily.   naproxen 500 MG tablet Commonly known as: Naprosyn Take 1 tablet (500 mg total) by mouth 2 (two)  times daily with a meal.   ondansetron 8 MG disintegrating tablet Commonly known as: ZOFRAN-ODT Dissolve 1/2  to 1 tablet by mouth 2 times a day as needed for nausea   ondansetron 8 MG disintegrating tablet Commonly known as: ZOFRAN-ODT Allow 1/2 to 1 tablet to dissolve twice a day as needed for nausea   ondansetron 8 MG disintegrating tablet Commonly known as: ZOFRAN-ODT Dissolve 1/2-1 tablet by mouth 2 times a day as needed for nausea   prazosin 1 MG capsule Commonly known as: MINIPRESS Take 1 capsule at night (together with 2 mg for total of 3 mg)   spironolactone 100 MG tablet Commonly known as: ALDACTONE Take 1 tablet by mouth 2 (two) times daily.   Tarina FE 1/20 EQ 1-20 MG-MCG tablet Generic drug: norethindrone-ethinyl estradiol-FE Take 1 tablet by mouth daily.   Wegovy 0.25 MG/0.5ML Soaj Generic drug: Semaglutide-Weight Management Inject 0.25 mg into the skin once a week.   Wegovy 0.5 MG/0.5ML Soaj Generic drug: Semaglutide-Weight Management Inject 0.5 mg into the skin once a week. To start after 1 month on the 0.25 mg weekly dose.          OBJECTIVE:   PHYSICAL EXAM: VS: There were no vitals taken for this visit.   EXAM: General: Pt appears well and is in NAD  Neck: General: Supple without adenopathy. Thyroid: Thyroid size normal.  No goiter or nodules appreciated.  Lungs: Clear with good BS bilat with no rales, rhonchi, or wheezes  Heart: Auscultation: RRR.  Abdomen: Normoactive bowel sounds, soft, nontender, without masses or organomegaly palpable  Extremities:  BL LE: No pretibial edema normal ROM and strength.  Skin: Hair: Texture and amount normal with gender appropriate distribution Skin Inspection: facial and neck scarring  Skin Palpation: Skin temperature, texture, and thickness normal to palpation  Mental Status: Judgment, insight: Intact Orientation: Oriented to time, place, and person Mood and affect: No depression, anxiety, or agitation      DATA REVIEWED:   Results for CERDAS ASYRIA, KOLANDER (MRN [id number]) as of 08/26/2021 14:32  Ref. Range 08/25/2021 15:08  Sodium Latest Ref Range: 135 - 145 mEq/L 138  Potassium Latest Ref Range: 3.5 - 5.1 mEq/L 4.2  Chloride Latest Ref Range: 96 - 112 mEq/L 107  CO2 Latest Ref Range: 19 - 32 mEq/L 25  Glucose Latest Ref Range: 70 - 99 mg/dL 98  BUN Latest Ref Range: 6 - 23 mg/dL 11  Creatinine Latest Ref Range: 0.40 - 1.20 mg/dL 0.66  Calcium Latest Ref Range: 8.4 - 10.5 mg/dL 8.8  Alkaline Phosphatase Latest Ref Range: 39 - 117 U/L 59  Albumin Latest Ref Range: 3.5 - 5.2 g/dL 3.8  AST Latest Ref Range: 0 - 37 U/L 16  ALT Latest Ref Range: 0 - 35 U/L 14  Total Protein Latest Ref Range: 6.0 - 8.3 g/dL 6.4  Total Bilirubin Latest Ref  Range: 0.2 - 1.2 mg/dL 0.3  GFR Latest Ref Range: >60.00 mL/min 119.88  TSH Latest Ref Range: 0.35 - 5.50 uIU/mL 2.41  T4,Free(Direct) Latest Ref Range: 0.60 - 1.60 ng/dL 6.96     12/10/5282   FSH 2.1 mIU/mL  LH 4.2 mIU/mL  Total testosterone 39.03 ng/dL  Free testosterone  8.0 pg/mL (reference 0.6-6.8)     Prolactin 10.9 ng/mL DHEAS 229 ug/dL    ASSESSMENT / PLAN / RECOMMENDATIONS:   Hirsutism/ Hx of Elevated Testosterone :    - Pt with symptoms of acne and hirsutism despite being on OCP's since 2018. We added Spironolactone in 7/ 2020  - Discussed side effects of teratogenicity and the importance of being on OCP's while on spironolactone - Hirsutism has resolved but she continues to follow up with dermatology for acne   Medications  Continue  spironolactone 100 mg BID  Continue Junel      F/U in 1 yr    Signed electronically by: Lyndle Herrlich, MD  [facility] Endocrinology  [facility] [address] [place] [place], [place] [address] Phone: [phone] FAX: [phone]      CC: Loyola Mast, MD [address] [place] [address] Phone: [phone]  Fax:  [phone]   Return to Endocrinology clinic as below: Future Appointments  Date Time Provider Department Center  09/07/2022  3:00 PM Chelsee Hosie, Konrad Dolores, MD [facility] None  09/10/2022  3:00 PM Loyola Mast, MD [facility]  10/08/2022  8:40 AM Veto Kemps, Bertram Millard, MD [facility]

## 2022-09-07 ENCOUNTER — Ambulatory Visit: Payer: Self-pay | Admitting: Internal Medicine

## 2022-09-09 ENCOUNTER — Other Ambulatory Visit (HOSPITAL_COMMUNITY): Payer: Self-pay

## 2022-09-10 ENCOUNTER — Ambulatory Visit: Payer: Commercial Managed Care - PPO | Admitting: Family Medicine

## 2022-09-10 ENCOUNTER — Ambulatory Visit: Payer: Self-pay | Admitting: Internal Medicine

## 2022-09-10 ENCOUNTER — Other Ambulatory Visit (HOSPITAL_COMMUNITY): Payer: Self-pay

## 2022-09-12 ENCOUNTER — Other Ambulatory Visit: Payer: Self-pay | Admitting: Family Medicine

## 2022-09-12 DIAGNOSIS — Z309 Encounter for contraceptive management, unspecified: Secondary | ICD-10-CM

## 2022-09-13 ENCOUNTER — Other Ambulatory Visit (HOSPITAL_COMMUNITY): Payer: Self-pay

## 2022-09-13 MED ORDER — NORETHIN ACE-ETH ESTRAD-FE 1-20 MG-MCG PO TABS
1.0000 | ORAL_TABLET | Freq: Every day | ORAL | 0 refills | Status: DC
  Filled 2022-09-13: qty 84, 84d supply, fill #0

## 2022-09-15 ENCOUNTER — Other Ambulatory Visit (HOSPITAL_COMMUNITY): Payer: Self-pay

## 2022-09-15 MED ORDER — AMPHETAMINE-DEXTROAMPHETAMINE 15 MG PO TABS
7.5000 mg | ORAL_TABLET | Freq: Every day | ORAL | 0 refills | Status: DC
  Filled 2022-09-15: qty 30, 30d supply, fill #0

## 2022-09-15 MED ORDER — AMPHETAMINE-DEXTROAMPHET ER 30 MG PO CP24
30.0000 mg | ORAL_CAPSULE | ORAL | 0 refills | Status: DC
  Filled 2022-09-15: qty 30, 30d supply, fill #0

## 2022-09-16 ENCOUNTER — Other Ambulatory Visit (HOSPITAL_COMMUNITY): Payer: Self-pay

## 2022-09-16 MED ORDER — AMPHETAMINE-DEXTROAMPHET ER 30 MG PO CP24
30.0000 mg | ORAL_CAPSULE | Freq: Every morning | ORAL | 0 refills | Status: DC
  Filled 2022-09-16 – 2023-01-17 (×2): qty 30, 30d supply, fill #0

## 2022-09-24 ENCOUNTER — Other Ambulatory Visit (HOSPITAL_COMMUNITY): Payer: Self-pay

## 2022-09-29 ENCOUNTER — Other Ambulatory Visit (HOSPITAL_COMMUNITY): Payer: Self-pay

## 2022-10-01 ENCOUNTER — Other Ambulatory Visit (HOSPITAL_COMMUNITY): Payer: Self-pay

## 2022-10-05 ENCOUNTER — Other Ambulatory Visit (HOSPITAL_COMMUNITY): Payer: Self-pay

## 2022-10-07 ENCOUNTER — Telehealth: Payer: Self-pay | Admitting: Family Medicine

## 2022-10-07 NOTE — Telephone Encounter (Signed)
Spoke to patient, Brittany Frazier is on back order and still hasn't started it yet.  She will reschedule an appt once she has gotten the medication. Dm/cma

## 2022-10-07 NOTE — Telephone Encounter (Signed)
Caller Name: Ariyan Sinnett Call back phone #: [phone]  Reason for Call: Pt has  yet to receive prescription for Ozempic. Please call with an update

## 2022-10-08 ENCOUNTER — Encounter: Payer: 59 | Admitting: Family Medicine

## 2022-10-12 ENCOUNTER — Other Ambulatory Visit (HOSPITAL_COMMUNITY): Payer: Self-pay

## 2022-10-13 ENCOUNTER — Other Ambulatory Visit (HOSPITAL_COMMUNITY): Payer: Self-pay

## 2022-10-13 ENCOUNTER — Other Ambulatory Visit: Payer: Self-pay

## 2022-10-13 DIAGNOSIS — F411 Generalized anxiety disorder: Secondary | ICD-10-CM | POA: Diagnosis not present

## 2022-10-13 DIAGNOSIS — F4310 Post-traumatic stress disorder, unspecified: Secondary | ICD-10-CM | POA: Diagnosis not present

## 2022-10-13 DIAGNOSIS — F339 Major depressive disorder, recurrent, unspecified: Secondary | ICD-10-CM | POA: Diagnosis not present

## 2022-10-14 ENCOUNTER — Other Ambulatory Visit (HOSPITAL_COMMUNITY): Payer: Self-pay

## 2022-10-14 MED ORDER — AMPHETAMINE-DEXTROAMPHET ER 30 MG PO CP24
30.0000 mg | ORAL_CAPSULE | Freq: Every morning | ORAL | 0 refills | Status: DC
  Filled 2022-10-14 – 2022-10-15 (×3): qty 30, 30d supply, fill #0

## 2022-10-14 MED ORDER — ONDANSETRON 8 MG PO TBDP
8.0000 mg | ORAL_TABLET | Freq: Two times a day (BID) | ORAL | 0 refills | Status: DC | PRN
  Filled 2022-10-14 (×2): qty 30, 15d supply, fill #0

## 2022-10-14 MED ORDER — CLONIDINE HCL ER 0.1 MG PO TB12
0.1000 mg | ORAL_TABLET | Freq: Every evening | ORAL | 0 refills | Status: DC | PRN
  Filled 2022-10-14 (×2): qty 180, 90d supply, fill #0

## 2022-10-14 MED ORDER — AMPHETAMINE-DEXTROAMPHETAMINE 15 MG PO TABS
7.5000 mg | ORAL_TABLET | Freq: Every day | ORAL | 0 refills | Status: AC
Start: 2022-10-13 — End: ?
  Filled 2022-10-14 (×2): qty 30, 30d supply, fill #0

## 2022-10-14 MED ORDER — ESCITALOPRAM OXALATE 20 MG PO TABS
20.0000 mg | ORAL_TABLET | Freq: Every morning | ORAL | 0 refills | Status: AC
  Filled 2022-10-14 (×2): qty 90, 90d supply, fill #0

## 2022-10-15 ENCOUNTER — Other Ambulatory Visit (HOSPITAL_COMMUNITY): Payer: Self-pay

## 2022-10-16 ENCOUNTER — Other Ambulatory Visit (HOSPITAL_COMMUNITY): Payer: Self-pay

## 2022-11-08 ENCOUNTER — Other Ambulatory Visit (HOSPITAL_COMMUNITY): Payer: Self-pay

## 2022-11-08 ENCOUNTER — Other Ambulatory Visit: Payer: Self-pay | Admitting: Family Medicine

## 2022-11-08 DIAGNOSIS — Z309 Encounter for contraceptive management, unspecified: Secondary | ICD-10-CM

## 2022-11-08 MED ORDER — NORETHIN ACE-ETH ESTRAD-FE 1-20 MG-MCG PO TABS
1.0000 | ORAL_TABLET | Freq: Every day | ORAL | 3 refills | Status: DC
  Filled 2022-11-08 – 2022-12-06 (×2): qty 84, 84d supply, fill #0
  Filled 2023-02-27: qty 84, 84d supply, fill #1
  Filled 2023-05-24: qty 84, 84d supply, fill #2
  Filled 2023-08-15: qty 84, 84d supply, fill #3

## 2022-11-08 NOTE — Telephone Encounter (Signed)
Refill request for  Brittany Frazier F/E 1/20  LR  09/13/22, #84, 0 rf LOV 07/09/22 FOV  none scheduled  Please review and advise. Thanks.Dm/cma

## 2022-11-10 DIAGNOSIS — F902 Attention-deficit hyperactivity disorder, combined type: Secondary | ICD-10-CM | POA: Diagnosis not present

## 2022-11-10 DIAGNOSIS — F411 Generalized anxiety disorder: Secondary | ICD-10-CM | POA: Diagnosis not present

## 2022-11-10 DIAGNOSIS — F339 Major depressive disorder, recurrent, unspecified: Secondary | ICD-10-CM | POA: Diagnosis not present

## 2022-11-10 DIAGNOSIS — G4700 Insomnia, unspecified: Secondary | ICD-10-CM | POA: Diagnosis not present

## 2022-11-11 ENCOUNTER — Other Ambulatory Visit: Payer: Self-pay

## 2022-11-11 ENCOUNTER — Other Ambulatory Visit (HOSPITAL_COMMUNITY): Payer: Self-pay

## 2022-11-11 MED ORDER — AMPHETAMINE-DEXTROAMPHETAMINE 15 MG PO TABS
7.5000 mg | ORAL_TABLET | ORAL | 0 refills | Status: AC | PRN
Start: 2022-11-10 — End: ?
  Filled 2022-11-11 (×2): qty 30, 30d supply, fill #0

## 2022-11-11 MED ORDER — ONDANSETRON 8 MG PO TBDP
4.0000 mg | ORAL_TABLET | Freq: Two times a day (BID) | ORAL | 2 refills | Status: DC
  Filled 2022-11-11: qty 30, 15d supply, fill #0
  Filled 2023-01-17: qty 30, 15d supply, fill #1
  Filled 2023-05-24: qty 30, 15d supply, fill #2

## 2022-11-11 MED ORDER — AMPHETAMINE-DEXTROAMPHET ER 30 MG PO CP24
30.0000 mg | ORAL_CAPSULE | Freq: Every morning | ORAL | 0 refills | Status: DC
  Filled 2022-11-11: qty 30, 30d supply, fill #0

## 2022-11-12 ENCOUNTER — Other Ambulatory Visit (HOSPITAL_COMMUNITY): Payer: Self-pay

## 2022-11-12 ENCOUNTER — Other Ambulatory Visit: Payer: Self-pay

## 2022-11-15 ENCOUNTER — Other Ambulatory Visit (HOSPITAL_COMMUNITY): Payer: Self-pay

## 2022-11-15 ENCOUNTER — Ambulatory Visit: Payer: Self-pay | Admitting: Internal Medicine

## 2022-11-22 ENCOUNTER — Ambulatory Visit (INDEPENDENT_AMBULATORY_CARE_PROVIDER_SITE_OTHER): Payer: Commercial Managed Care - PPO | Admitting: Internal Medicine

## 2022-11-22 VITALS — BP 136/84 | HR 115 | Ht 65.5 in | Wt 197.0 lb

## 2022-11-22 DIAGNOSIS — R7989 Other specified abnormal findings of blood chemistry: Secondary | ICD-10-CM | POA: Diagnosis not present

## 2022-11-22 DIAGNOSIS — L680 Hirsutism: Secondary | ICD-10-CM | POA: Diagnosis not present

## 2022-11-22 NOTE — Patient Instructions (Signed)
Stop spironolactone

## 2022-11-22 NOTE — Progress Notes (Signed)
Name: Brittany Frazier  MRN/ DOB: [id number], 1991-03-25    Age/ Sex: 32 y.o., female     PCP: Loyola Mast, MD   Reason for Endocrinology Evaluation: Hirsutism      Initial Endocrinology Clinic Visit: 05/11/2019    PATIENT IDENTIFIER: 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 She has followed with [facility] since 05/11/2019 for consultative assistance with management of her Hirsutism.   HISTORICAL SUMMARY:  Pt was referred by Dr. Sindy Messing (Dermatolgoy ) for previously elevated  Testosterone. She has seen Dr. Talmage Nap in the past.  She was prescribed metformin at the time due to a diagnosis of pre-diabtes but was lost to follow up and did not continue to take it. She lost a lot of weight with lifestyle changes after college.   She has been  treated for severe cystic acne, and steatocytoma multiplex. She was on accutane and spironolactone 50 mg daily for~ 8 months through dermatology.   Menarche at 72 yrs of age with regular period  But with  dysmenorrhea  Has been  on Junel since 2018 Sprironolactone added 05/2019   SUBJECTIVE:    Today (11/22/2022):  [name] is here for a follow up on cystic acne, hx of elevated testosterone and hirsutism.   She continues to be on Spironolactone and COC's   Denies dizziness  Denies LE edema  Last time she used epilator 2 weeks ago  She has gained weight that she is wondering if spironolactone has anything to do with it  Has noted frequency  Sporadic acne    HISTORY:  Past Medical History:  Past Medical History:  Diagnosis Date   GERD (gastroesophageal reflux disease)    Past Surgical History:  Past Surgical History:  Procedure Laterality Date   HERNIA REPAIR Bilateral    Social History:  reports that she has been smoking cigarettes. She has never used  smokeless tobacco. She reports current alcohol use. She reports that she does not use drugs. Family History:  Family History  Problem Relation Age of Onset   Thyroid disease Mother    Stroke Maternal Uncle    Cancer Paternal Uncle        Colon   Heart disease Maternal Grandmother    Stroke Maternal Grandfather    Diabetes Maternal Grandfather    Heart disease Maternal Grandfather    Diabetes Paternal Grandfather    Stroke Paternal Grandfather      HOME MEDICATIONS: Allergies as of 11/22/2022       Reactions   Benzoyl Peroxide Rash   Sulfa Antibiotics Rash        Medication List        Accurate as of November 22, 2022  3:09 PM. If you have any questions, ask your nurse or doctor.          STOP taking these medications    amoxicillin-clavulanate 875-125 MG tablet Commonly known as: AUGMENTIN       TAKE these medications    amphetamine-dextroamphetamine 15 MG tablet Commonly known as: ADDERALL Take 1/2-1 tablet by mouth as needed  early afternoon for breakthrough ADHD symptoms   amphetamine-dextroamphetamine 15 MG tablet Commonly known as: ADDERALL Take 0.5-1 tablets by mouth as needed in the early afternoon for breakthrough ADHD symptoms   Adderall XR 30 MG 24 hr capsule Generic drug: amphetamine-dextroamphetamine Take  1 capsule (30 mg total) by mouth every morning.   amphetamine-dextroamphetamine 30 MG 24 hr capsule Commonly known as: Adderall XR Take 1 capsule (30 mg total) by mouth every morning.   amphetamine-dextroamphetamine 15 MG tablet Commonly known as: ADDERALL Take 0.5-1 tablets by mouth daily in the afternoon as needed for breakthrough ADHD symptoms   amphetamine-dextroamphetamine 30 MG 24 hr capsule Commonly known as: ADDERALL XR Take 1 capsule (30 mg total) by mouth in the morning.   amphetamine-dextroamphetamine 15 MG tablet Commonly known as: ADDERALL Take 0.5-1 tablets by mouth as needed early afternoon for breakthrough ADHD  symptoms   amphetamine-dextroamphetamine 30 MG 24 hr capsule Commonly known as: ADDERALL XR Take 1 capsule (30 mg total) by mouth in the morning.   cloNIDine HCl 0.1 MG Tb12 ER tablet Commonly known as: KAPVAY Take 1-2 tablets by mouth every night for nightmares, flashbacks, and sleep.   cloNIDine HCl 0.1 MG Tb12 ER tablet Commonly known as: KAPVAY Take 1-2 tablets (0.1-0.2 mg total) by mouth at bedtime as needed for nightmares, flashbacks, and sleep   cloNIDine HCl 0.1 MG Tb12 ER tablet Commonly known as: KAPVAY Take 1-2 tablets (0.1-0.2 mg total) by mouth at bedtime as needed for flashbacks, nightmares or sleep   escitalopram 20 MG tablet Commonly known as: LEXAPRO Take 1 tablet (20 mg total) by mouth in the morning.   fluticasone 50 MCG/ACT nasal spray Commonly known as: FLONASE Place 2 sprays into both nostrils daily.   naproxen 500 MG tablet Commonly known as: Naprosyn Take 1 tablet (500 mg total) by mouth 2 (two) times daily with a meal.   norethindrone-ethinyl estradiol-FE 1-20 MG-MCG tablet Commonly known as: Tarina FE 1/20 EQ Take 1 tablet by mouth daily.   ondansetron 8 MG disintegrating tablet Commonly known as: ZOFRAN-ODT Dissolve 1/2  to 1 tablet by mouth 2 times a day as needed for nausea   ondansetron 8 MG disintegrating tablet Commonly known as: ZOFRAN-ODT Allow 1/2 to 1 tablet to dissolve twice a day as needed for nausea   ondansetron 8 MG disintegrating tablet Commonly known as: ZOFRAN-ODT Dissolve 1/2-1 tablet by mouth 2 times a day as needed for nausea   ondansetron 8 MG disintegrating tablet Commonly known as: ZOFRAN-ODT Take 1 tablet (8 mg total) by mouth 2 (two) times daily as needed for nausea   ondansetron 8 MG disintegrating tablet Commonly known as: ZOFRAN-ODT Take 0.5-1 tablets (4-8 mg total) by mouth 2 (two) times daily for nausea as needed.   prazosin 1 MG capsule Commonly known as: MINIPRESS Take 1 capsule at night (together with  2 mg for total of 3 mg)   spironolactone 100 MG tablet Commonly known as: ALDACTONE Take 1 tablet by mouth 2 (two) times daily.   Wegovy 0.25 MG/0.5ML Soaj Generic drug: Semaglutide-Weight Management Inject 0.25 mg into the skin once a week.   Wegovy 0.5 MG/0.5ML Soaj Generic drug: Semaglutide-Weight Management Inject 0.5 mg into the skin once a week. To start after 1 month on the 0.25 mg weekly dose.          OBJECTIVE:   PHYSICAL EXAM: VS: BP 136/84 (BP Location: Right Arm, Patient Position: Sitting, Cuff Size: Normal)   Pulse (!) 115   Ht 5' 5.5" (1.664 m)   Wt 197 lb (89.4 kg)   SpO2 99%   BMI 32.28 kg/m    EXAM: General: Pt appears well and is in NAD  Neck: General: Supple without adenopathy. Thyroid: Thyroid size normal.  No  goiter or nodules appreciated.  Lungs: Clear with good BS bilat with no rales, rhonchi, or wheezes  Heart: Auscultation: RRR.  Abdomen: Normoactive bowel sounds, soft, nontender, without masses or organomegaly palpable  Extremities:  BL LE: No pretibial edema normal ROM and strength.  Skin: Hair: Texture and amount normal with gender appropriate distribution Skin Inspection: facial and neck scarring , no facial hair or stumbles detected on exam today  Mental Status: Judgment, insight: Intact Orientation: Oriented to time, place, and person Mood and affect: No depression, anxiety, or agitation     DATA REVIEWED:  Latest Reference Range & Units 11/22/22 15:23  Sodium 135 - 145 mEq/L 138  Potassium 3.5 - 5.1 mEq/L 4.0  Chloride 96 - 112 mEq/L 105  CO2 19 - 32 mEq/L 25  Glucose 70 - 99 mg/dL 96  BUN 6 - 23 mg/dL 7  Creatinine 0.40 - 1.20 mg/dL 0.60  Calcium 8.4 - 10.5 mg/dL 8.5  GFR >60.00 mL/min 121.59    Latest Reference Range & Units 11/22/22 15:23  TSH 0.35 - 5.50 uIU/mL 3.44  T4,Free(Direct) 0.60 - 1.60 ng/dL 0.59 (L)  (L): Data is abnormally low  11/07/2012   FSH 2.1 mIU/mL  LH 4.2 mIU/mL  Total testosterone 39.03 ng/dL   Free testosterone  8.0 pg/mL (reference 0.6-6.8)     Prolactin 10.9 ng/mL DHEAS 229 ug/dL    ASSESSMENT / PLAN / RECOMMENDATIONS:   Hirsutism/ Hx of Elevated Testosterone :    - Pt with symptoms of acne and hirsutism despite being on OCP's since 2018. We added Spironolactone in 7/ 2020  -Patient attributes weight gain to spironolactone and she would like to discontinue -Patient will continue to use topical hair removal remedies as needed    Medications  Stop spironolactone Continue Junel      2. Low FT4:  -other than weight gain, patient has no other symptoms -Her TSH is normal which is discordant with a low free T4.  Suspect assay interference, versus thyroiditis -Will repeat TFTs in 2 months   F/U as needed   Signed electronically by: Mack Guise, MD  [facility] Endocrinology  [facility] [address] [place], [place] [address] Phone: [phone] FAX: [phone]      CC: Haydee Salter, [place] [address] Phone: [phone]  Fax: [phone]   Return to Endocrinology clinic as below: No future appointments.

## 2022-11-23 ENCOUNTER — Encounter: Payer: Self-pay | Admitting: Internal Medicine

## 2022-11-23 LAB — BASIC METABOLIC PANEL
BUN: 7 mg/dL (ref 6–23)
CO2: 25 mEq/L (ref 19–32)
Calcium: 8.5 mg/dL (ref 8.4–10.5)
Chloride: 105 mEq/L (ref 96–112)
Creatinine, Ser: 0.6 mg/dL (ref 0.40–1.20)
GFR: 121.59 mL/min (ref >60.00)
Glucose, Bld: 96 mg/dL (ref 70–99)
Potassium: 4 mEq/L (ref 3.5–5.1)
Sodium: 138 mEq/L (ref 135–145)

## 2022-11-23 LAB — T4, FREE: Free T4: 0.59 ng/dL — ABNORMAL LOW (ref 0.60–1.60)

## 2022-11-23 LAB — TSH: TSH: 3.44 u[IU]/mL (ref 0.35–5.50)

## 2022-12-06 ENCOUNTER — Other Ambulatory Visit (HOSPITAL_COMMUNITY): Payer: Self-pay

## 2022-12-06 ENCOUNTER — Other Ambulatory Visit: Payer: Self-pay

## 2022-12-15 ENCOUNTER — Other Ambulatory Visit (HOSPITAL_COMMUNITY): Payer: Self-pay

## 2022-12-16 ENCOUNTER — Other Ambulatory Visit (HOSPITAL_COMMUNITY): Payer: Self-pay

## 2022-12-16 MED ORDER — CLONIDINE HCL ER 0.1 MG PO TB12
0.1000 mg | ORAL_TABLET | Freq: Every day | ORAL | 0 refills | Status: AC
Start: 2022-12-16 — End: ?
  Filled 2022-12-22: qty 180, 90d supply, fill #0

## 2022-12-17 ENCOUNTER — Other Ambulatory Visit (HOSPITAL_COMMUNITY): Payer: Self-pay

## 2022-12-17 ENCOUNTER — Other Ambulatory Visit: Payer: Self-pay

## 2022-12-17 MED ORDER — AMPHETAMINE-DEXTROAMPHET ER 30 MG PO CP24
30.0000 mg | ORAL_CAPSULE | Freq: Every morning | ORAL | 0 refills | Status: DC
  Filled 2022-12-17: qty 30, 30d supply, fill #0

## 2022-12-17 MED ORDER — AMPHETAMINE-DEXTROAMPHETAMINE 15 MG PO TABS
7.5000 mg | ORAL_TABLET | Freq: Every day | ORAL | 0 refills | Status: AC
Start: 2022-12-17 — End: ?
  Filled 2022-12-17: qty 30, 30d supply, fill #0

## 2022-12-20 ENCOUNTER — Other Ambulatory Visit (HOSPITAL_COMMUNITY): Payer: Self-pay

## 2022-12-20 ENCOUNTER — Other Ambulatory Visit: Payer: Self-pay

## 2022-12-22 ENCOUNTER — Other Ambulatory Visit (HOSPITAL_COMMUNITY): Payer: Self-pay

## 2023-01-05 DIAGNOSIS — F339 Major depressive disorder, recurrent, unspecified: Secondary | ICD-10-CM | POA: Diagnosis not present

## 2023-01-05 DIAGNOSIS — F4310 Post-traumatic stress disorder, unspecified: Secondary | ICD-10-CM | POA: Diagnosis not present

## 2023-01-05 DIAGNOSIS — F902 Attention-deficit hyperactivity disorder, combined type: Secondary | ICD-10-CM | POA: Diagnosis not present

## 2023-01-05 DIAGNOSIS — F411 Generalized anxiety disorder: Secondary | ICD-10-CM | POA: Diagnosis not present

## 2023-01-06 ENCOUNTER — Other Ambulatory Visit (HOSPITAL_COMMUNITY): Payer: Self-pay

## 2023-01-07 ENCOUNTER — Other Ambulatory Visit (HOSPITAL_COMMUNITY): Payer: Self-pay

## 2023-01-07 MED ORDER — ESCITALOPRAM OXALATE 20 MG PO TABS
20.0000 mg | ORAL_TABLET | Freq: Every morning | ORAL | 0 refills | Status: DC
  Filled 2023-01-07: qty 90, 90d supply, fill #0

## 2023-01-07 MED ORDER — AMPHETAMINE-DEXTROAMPHET ER 30 MG PO CP24
30.0000 mg | ORAL_CAPSULE | Freq: Every day | ORAL | 0 refills | Status: DC
  Filled 2023-02-14: qty 30, 30d supply, fill #0

## 2023-01-07 MED ORDER — AMPHETAMINE-DEXTROAMPHETAMINE 10 MG PO TABS
5.0000 mg | ORAL_TABLET | Freq: Every day | ORAL | 0 refills | Status: AC | PRN
Start: 2023-01-05 — End: ?
  Filled 2023-01-07: qty 30, 30d supply, fill #0

## 2023-01-07 MED ORDER — CLONIDINE HCL ER 0.1 MG PO TB12
0.1000 mg | ORAL_TABLET | Freq: Every day | ORAL | 0 refills | Status: AC
Start: 2023-01-05 — End: ?
  Filled 2023-01-07: qty 180, 90d supply, fill #0

## 2023-01-08 ENCOUNTER — Other Ambulatory Visit (HOSPITAL_COMMUNITY): Payer: Self-pay

## 2023-01-11 ENCOUNTER — Other Ambulatory Visit (HOSPITAL_COMMUNITY): Payer: Self-pay

## 2023-01-11 ENCOUNTER — Other Ambulatory Visit: Payer: Self-pay

## 2023-01-17 ENCOUNTER — Other Ambulatory Visit (HOSPITAL_COMMUNITY): Payer: Self-pay

## 2023-01-21 ENCOUNTER — Other Ambulatory Visit (INDEPENDENT_AMBULATORY_CARE_PROVIDER_SITE_OTHER): Payer: Commercial Managed Care - PPO

## 2023-01-21 DIAGNOSIS — R7989 Other specified abnormal findings of blood chemistry: Secondary | ICD-10-CM

## 2023-01-22 LAB — TSH: TSH: 2.73 mIU/L

## 2023-01-22 LAB — T4, FREE: Free T4: 0.9 ng/dL (ref 0.8–1.8)

## 2023-02-14 ENCOUNTER — Other Ambulatory Visit (HOSPITAL_COMMUNITY): Payer: Self-pay

## 2023-02-14 ENCOUNTER — Other Ambulatory Visit: Payer: Self-pay

## 2023-02-15 ENCOUNTER — Other Ambulatory Visit: Payer: Self-pay

## 2023-02-17 ENCOUNTER — Other Ambulatory Visit (HOSPITAL_COMMUNITY): Payer: Self-pay

## 2023-02-17 ENCOUNTER — Other Ambulatory Visit: Payer: Self-pay

## 2023-02-17 MED ORDER — AMPHETAMINE-DEXTROAMPHETAMINE 10 MG PO TABS
5.0000 mg | ORAL_TABLET | Freq: Every day | ORAL | 0 refills | Status: DC
  Filled 2023-02-17: qty 30, 30d supply, fill #0

## 2023-02-23 ENCOUNTER — Other Ambulatory Visit: Payer: Self-pay

## 2023-02-23 ENCOUNTER — Other Ambulatory Visit (HOSPITAL_COMMUNITY): Payer: Self-pay

## 2023-02-23 MED ORDER — ONDANSETRON 8 MG PO TBDP
8.0000 mg | ORAL_TABLET | Freq: Three times a day (TID) | ORAL | 0 refills | Status: DC | PRN
  Filled 2023-02-23: qty 30, 10d supply, fill #0

## 2023-02-28 ENCOUNTER — Other Ambulatory Visit: Payer: Self-pay

## 2023-03-15 ENCOUNTER — Other Ambulatory Visit (HOSPITAL_COMMUNITY): Payer: Self-pay

## 2023-03-15 DIAGNOSIS — F902 Attention-deficit hyperactivity disorder, combined type: Secondary | ICD-10-CM | POA: Diagnosis not present

## 2023-03-15 DIAGNOSIS — F411 Generalized anxiety disorder: Secondary | ICD-10-CM | POA: Diagnosis not present

## 2023-03-15 DIAGNOSIS — F4310 Post-traumatic stress disorder, unspecified: Secondary | ICD-10-CM | POA: Diagnosis not present

## 2023-03-15 DIAGNOSIS — F339 Major depressive disorder, recurrent, unspecified: Secondary | ICD-10-CM | POA: Diagnosis not present

## 2023-03-15 MED ORDER — AMPHETAMINE-DEXTROAMPHETAMINE 10 MG PO TABS
5.0000 mg | ORAL_TABLET | Freq: Every day | ORAL | 0 refills | Status: DC
  Filled 2023-03-15: qty 30, 30d supply, fill #0

## 2023-03-15 MED ORDER — AMPHETAMINE-DEXTROAMPHET ER 30 MG PO CP24
30.0000 mg | ORAL_CAPSULE | Freq: Every morning | ORAL | 0 refills | Status: DC
  Filled 2023-03-15: qty 30, 30d supply, fill #0

## 2023-03-15 MED ORDER — ESCITALOPRAM OXALATE 20 MG PO TABS
20.0000 mg | ORAL_TABLET | Freq: Every morning | ORAL | 0 refills | Status: DC
  Filled 2023-04-14: qty 90, 90d supply, fill #0

## 2023-03-15 MED ORDER — CLONIDINE HCL ER 0.1 MG PO TB12
0.1000 mg | ORAL_TABLET | Freq: Every day | ORAL | 0 refills | Status: DC
  Filled 2023-03-15: qty 180, 90d supply, fill #0

## 2023-03-15 MED ORDER — PRAZOSIN HCL 1 MG PO CAPS
1.0000 mg | ORAL_CAPSULE | Freq: Two times a day (BID) | ORAL | 2 refills | Status: AC | PRN
Start: 2023-03-15 — End: ?
  Filled 2023-03-15: qty 60, 30d supply, fill #0

## 2023-03-15 MED ORDER — AMPHETAMINE-DEXTROAMPHET ER 30 MG PO CP24
30.0000 mg | ORAL_CAPSULE | Freq: Every day | ORAL | 0 refills | Status: DC
  Filled 2023-04-14: qty 30, 30d supply, fill #0

## 2023-03-16 ENCOUNTER — Other Ambulatory Visit: Payer: Self-pay

## 2023-04-12 ENCOUNTER — Other Ambulatory Visit (HOSPITAL_COMMUNITY): Payer: Self-pay

## 2023-04-12 ENCOUNTER — Other Ambulatory Visit: Payer: Self-pay

## 2023-04-12 MED ORDER — AMPHETAMINE-DEXTROAMPHETAMINE 10 MG PO TABS
5.0000 mg | ORAL_TABLET | Freq: Every day | ORAL | 0 refills | Status: DC | PRN
  Filled 2023-04-12 – 2023-04-15 (×3): qty 30, 30d supply, fill #0

## 2023-04-12 MED ORDER — ONDANSETRON 8 MG PO TBDP
4.0000 mg | ORAL_TABLET | Freq: Two times a day (BID) | ORAL | 0 refills | Status: DC | PRN
  Filled 2023-04-12: qty 30, 15d supply, fill #0

## 2023-04-14 ENCOUNTER — Other Ambulatory Visit (HOSPITAL_COMMUNITY): Payer: Self-pay

## 2023-04-15 ENCOUNTER — Other Ambulatory Visit: Payer: Self-pay

## 2023-05-13 ENCOUNTER — Other Ambulatory Visit (HOSPITAL_COMMUNITY): Payer: Self-pay

## 2023-05-13 MED ORDER — AMPHETAMINE-DEXTROAMPHET ER 30 MG PO CP24
30.0000 mg | ORAL_CAPSULE | Freq: Every day | ORAL | 0 refills | Status: DC
  Filled 2023-05-13: qty 30, 30d supply, fill #0

## 2023-05-13 MED ORDER — AMPHETAMINE-DEXTROAMPHETAMINE 10 MG PO TABS
5.0000 mg | ORAL_TABLET | ORAL | 0 refills | Status: DC | PRN
  Filled 2023-05-13: qty 30, 30d supply, fill #0

## 2023-05-24 ENCOUNTER — Other Ambulatory Visit: Payer: Self-pay

## 2023-06-13 ENCOUNTER — Other Ambulatory Visit (HOSPITAL_COMMUNITY): Payer: Self-pay

## 2023-06-13 DIAGNOSIS — F339 Major depressive disorder, recurrent, unspecified: Secondary | ICD-10-CM | POA: Diagnosis not present

## 2023-06-13 DIAGNOSIS — F411 Generalized anxiety disorder: Secondary | ICD-10-CM | POA: Diagnosis not present

## 2023-06-13 DIAGNOSIS — F902 Attention-deficit hyperactivity disorder, combined type: Secondary | ICD-10-CM | POA: Diagnosis not present

## 2023-06-13 DIAGNOSIS — F4310 Post-traumatic stress disorder, unspecified: Secondary | ICD-10-CM | POA: Diagnosis not present

## 2023-06-13 MED ORDER — ESCITALOPRAM OXALATE 20 MG PO TABS
20.0000 mg | ORAL_TABLET | Freq: Every morning | ORAL | 0 refills | Status: DC
  Filled 2023-06-13: qty 90, 90d supply, fill #0

## 2023-06-13 MED ORDER — PRAZOSIN HCL 1 MG PO CAPS
1.0000 mg | ORAL_CAPSULE | Freq: Two times a day (BID) | ORAL | 2 refills | Status: DC | PRN
  Filled 2023-06-13: qty 60, 30d supply, fill #0

## 2023-06-13 MED ORDER — CLONIDINE HCL ER 0.1 MG PO TB12
0.1000 mg | ORAL_TABLET | Freq: Every evening | ORAL | 0 refills | Status: DC
  Filled 2023-06-13: qty 180, 90d supply, fill #0

## 2023-06-13 MED ORDER — AMPHETAMINE-DEXTROAMPHET ER 30 MG PO CP24
30.0000 mg | ORAL_CAPSULE | Freq: Every morning | ORAL | 0 refills | Status: DC
  Filled 2023-06-13: qty 30, 30d supply, fill #0

## 2023-06-13 MED ORDER — AMPHETAMINE-DEXTROAMPHETAMINE 10 MG PO TABS
5.0000 mg | ORAL_TABLET | Freq: Every day | ORAL | 0 refills | Status: DC | PRN
  Filled 2023-06-13: qty 30, 30d supply, fill #0

## 2023-06-14 ENCOUNTER — Other Ambulatory Visit: Payer: Self-pay

## 2023-07-11 ENCOUNTER — Other Ambulatory Visit (HOSPITAL_COMMUNITY): Payer: Self-pay

## 2023-07-11 DIAGNOSIS — F339 Major depressive disorder, recurrent, unspecified: Secondary | ICD-10-CM | POA: Diagnosis not present

## 2023-07-11 DIAGNOSIS — F411 Generalized anxiety disorder: Secondary | ICD-10-CM | POA: Diagnosis not present

## 2023-07-11 DIAGNOSIS — F902 Attention-deficit hyperactivity disorder, combined type: Secondary | ICD-10-CM | POA: Diagnosis not present

## 2023-07-11 DIAGNOSIS — F4310 Post-traumatic stress disorder, unspecified: Secondary | ICD-10-CM | POA: Diagnosis not present

## 2023-07-11 MED ORDER — AMPHETAMINE-DEXTROAMPHETAMINE 10 MG PO TABS
5.0000 mg | ORAL_TABLET | Freq: Every day | ORAL | 0 refills | Status: DC
  Filled 2023-07-12 (×2): qty 30, 30d supply, fill #0

## 2023-07-11 MED ORDER — AMPHETAMINE-DEXTROAMPHET ER 30 MG PO CP24
30.0000 mg | ORAL_CAPSULE | Freq: Every morning | ORAL | 0 refills | Status: DC
  Filled 2023-08-11: qty 30, 30d supply, fill #0

## 2023-07-11 MED ORDER — AMPHETAMINE-DEXTROAMPHET ER 30 MG PO CP24
30.0000 mg | ORAL_CAPSULE | Freq: Every day | ORAL | 0 refills | Status: DC
  Filled 2023-07-12: qty 30, 30d supply, fill #0

## 2023-07-12 ENCOUNTER — Other Ambulatory Visit: Payer: Self-pay

## 2023-07-12 ENCOUNTER — Other Ambulatory Visit (HOSPITAL_COMMUNITY): Payer: Self-pay

## 2023-07-13 ENCOUNTER — Other Ambulatory Visit (HOSPITAL_COMMUNITY): Payer: Self-pay

## 2023-07-21 ENCOUNTER — Telehealth: Payer: Commercial Managed Care - PPO | Admitting: Physician Assistant

## 2023-07-21 DIAGNOSIS — H60391 Other infective otitis externa, right ear: Secondary | ICD-10-CM

## 2023-07-21 MED ORDER — NEOMYCIN-POLYMYXIN-HC 3.5-10000-1 OT SOLN
4.0000 [drp] | Freq: Four times a day (QID) | OTIC | 0 refills | Status: AC
Start: 2023-07-21 — End: 2023-08-04
  Filled 2023-07-21: qty 10, 13d supply, fill #0

## 2023-07-21 MED ORDER — AMOXICILLIN-POT CLAVULANATE 875-125 MG PO TABS
1.0000 | ORAL_TABLET | Freq: Two times a day (BID) | ORAL | 0 refills | Status: DC
  Filled 2023-07-21: qty 14, 7d supply, fill #0

## 2023-07-21 NOTE — Progress Notes (Signed)
I have spent 5 minutes in review of e-visit questionnaire, review and updating patient chart, medical decision making and response to patient.   Mia Milan Cody Jacklynn Dehaas, PA-C    

## 2023-07-21 NOTE — Progress Notes (Signed)
E Visit for Ear Pain - Swimmer's Ear  We are sorry that you are not feeling well. Here is how we plan to help!  Based on what you have shared with me it looks like you have Swimmer's Ear.  Swimmer's ear is a redness or swelling, irritation, or infection of your outer ear canal. These symptoms usually occur within a few days of swimming. Your ear canal is a tube that goes from the opening of the ear to the eardrum.  When water stays in your ear canal, germs can grow.  This is a painful condition that often happens to children and swimmers of all ages.  It is not contagious and oral antibiotics are not required to treat uncomplicated swimmer's ear.  The usual symptoms include:    Itchiness inside the ear  Redness or a sense of swelling in the ear  Pain when the ear is tugged on when pressure is placed on the ear  Pus draining from the infected ear   I have prescribed Augmentin 875-125 mg one tablet by mouth twice a day for 10 days  I have prescribed: Neomycin 0.35%, polymyxin B 10,000 units/mL, and hydrocortisone 0,5% otic solution 4 drops in affected ears four times a day for 7 days  In certain cases, swimmer's ear may progress to a more serious bacterial infection of the middle or inner ear.  If you have a fever 102 and up and significantly worsening symptoms, this could indicate a more serious infection moving to the middle/inner and needs face to face evaluation in an office by a provider.  Your symptoms should improve over the next 3 days and should resolve in about 7 days.  Be sure to complete ALL of your prescription.  HOME CARE: Wash your hands frequently. If you are prescribed an ear drop, do not place the tip of the bottle on your ear or touch it with your fingers. You can take Acetaminophen 650 mg every 4-6 hours as needed for pain.  If pain is severe or moderate, you can apply a heating pad (set on low) or hot water bottle (wrapped in a towel) to outer ear for 20 minutes.  This will  also increase drainage. Avoid ear plugs Do not go swimming until the symptoms are gone Do not use Q-tips After showers, help the water run out by tilting your head to one side.   GET HELP RIGHT AWAY IF: Fever is over 102.2 degrees. You develop progressive ear pain or hearing loss. Ear symptoms persist longer than 3 days after treatment.  MAKE SURE YOU: Understand these instructions. Will watch your condition. Will get help right away if you are not doing well or get worse.  TO PREVENT SWIMMER'S EAR: Use a bathing cap or custom fitted swim molds to keep your ears dry. Towel off after swimming to dry your ears. Tilt your head or pull your earlobes to allow the water to escape your ear canal. If there is still water in your ears, consider using a hairdryer on the lowest setting.  Thank you for choosing an e-visit.  Your e-visit answers were reviewed by a board certified advanced clinical practitioner to complete your personal care plan. Depending upon the condition, your plan could have included both over the counter or prescription medications.  Please review your pharmacy choice. Make sure the pharmacy is open so you can pick up the prescription now. If there is a problem, you may contact your provider through [employer] and have the  prescription routed to another pharmacy.  Your safety is important to [place]. If you have drug allergies check your prescription carefully.   For the next 24 hours you can use MyChart to ask questions about today's visit, request a non-urgent call back, or ask for a work or school excuse. You will get an email with a survey after your eVisit asking about your experience. We would appreciate your feedback. I hope that your e-visit has been valuable and will aid in your recovery.

## 2023-07-22 ENCOUNTER — Other Ambulatory Visit (HOSPITAL_COMMUNITY): Payer: Self-pay

## 2023-08-11 ENCOUNTER — Other Ambulatory Visit: Payer: Self-pay

## 2023-08-11 ENCOUNTER — Other Ambulatory Visit (HOSPITAL_COMMUNITY): Payer: Self-pay

## 2023-08-11 MED ORDER — AMPHETAMINE-DEXTROAMPHETAMINE 10 MG PO TABS
5.0000 mg | ORAL_TABLET | Freq: Every day | ORAL | 0 refills | Status: AC | PRN
Start: 2023-08-11 — End: ?
  Filled 2023-08-11: qty 30, 30d supply, fill #0

## 2023-08-11 MED ORDER — ONDANSETRON 8 MG PO TBDP
4.0000 mg | ORAL_TABLET | Freq: Two times a day (BID) | ORAL | 0 refills | Status: AC | PRN
Start: 2023-08-11 — End: ?
  Filled 2023-08-11: qty 30, 15d supply, fill #0

## 2023-08-12 ENCOUNTER — Other Ambulatory Visit: Payer: Self-pay

## 2023-08-12 ENCOUNTER — Other Ambulatory Visit (HOSPITAL_COMMUNITY): Payer: Self-pay

## 2023-08-15 ENCOUNTER — Other Ambulatory Visit (HOSPITAL_COMMUNITY): Payer: Self-pay

## 2023-09-07 DIAGNOSIS — H52223 Regular astigmatism, bilateral: Secondary | ICD-10-CM | POA: Diagnosis not present

## 2023-09-12 ENCOUNTER — Other Ambulatory Visit (HOSPITAL_COMMUNITY): Payer: Self-pay

## 2023-09-12 ENCOUNTER — Other Ambulatory Visit: Payer: Self-pay

## 2023-09-12 MED ORDER — AMPHETAMINE-DEXTROAMPHETAMINE 10 MG PO TABS
10.0000 mg | ORAL_TABLET | ORAL | 0 refills | Status: DC
  Filled 2023-09-12 (×2): qty 30, 30d supply, fill #0

## 2023-09-12 MED ORDER — AMPHETAMINE-DEXTROAMPHET ER 30 MG PO CP24
30.0000 mg | ORAL_CAPSULE | Freq: Every morning | ORAL | 0 refills | Status: DC
Start: 2023-09-12 — End: 2023-09-27
  Filled 2023-09-12 – 2023-09-14 (×2): qty 30, 30d supply, fill #0

## 2023-09-14 ENCOUNTER — Other Ambulatory Visit (HOSPITAL_COMMUNITY): Payer: Self-pay

## 2023-09-15 ENCOUNTER — Other Ambulatory Visit (HOSPITAL_COMMUNITY): Payer: Self-pay

## 2023-09-26 DIAGNOSIS — F411 Generalized anxiety disorder: Secondary | ICD-10-CM | POA: Diagnosis not present

## 2023-09-26 DIAGNOSIS — F902 Attention-deficit hyperactivity disorder, combined type: Secondary | ICD-10-CM | POA: Diagnosis not present

## 2023-09-26 DIAGNOSIS — F339 Major depressive disorder, recurrent, unspecified: Secondary | ICD-10-CM | POA: Diagnosis not present

## 2023-09-26 DIAGNOSIS — F4310 Post-traumatic stress disorder, unspecified: Secondary | ICD-10-CM | POA: Diagnosis not present

## 2023-09-27 ENCOUNTER — Other Ambulatory Visit (HOSPITAL_COMMUNITY): Payer: Self-pay

## 2023-09-27 ENCOUNTER — Other Ambulatory Visit: Payer: Self-pay

## 2023-09-27 MED ORDER — AMPHETAMINE-DEXTROAMPHETAMINE 10 MG PO TABS
5.0000 mg | ORAL_TABLET | Freq: Every day | ORAL | 0 refills | Status: DC | PRN
  Filled 2023-10-13: qty 30, 30d supply, fill #0

## 2023-09-27 MED ORDER — ESCITALOPRAM OXALATE 20 MG PO TABS
20.0000 mg | ORAL_TABLET | Freq: Every morning | ORAL | 0 refills | Status: DC
  Filled 2023-09-27: qty 90, 90d supply, fill #0

## 2023-09-27 MED ORDER — AMPHETAMINE-DEXTROAMPHET ER 30 MG PO CP24
30.0000 mg | ORAL_CAPSULE | Freq: Every morning | ORAL | 0 refills | Status: AC
Start: 2023-11-23 — End: ?

## 2023-09-27 MED ORDER — CLONIDINE HCL ER 0.1 MG PO TB12
0.1000 mg | ORAL_TABLET | Freq: Every evening | ORAL | 0 refills | Status: AC | PRN
Start: 2023-09-26 — End: ?
  Filled 2023-09-27: qty 180, 90d supply, fill #0

## 2023-09-27 MED ORDER — AMPHETAMINE-DEXTROAMPHET ER 30 MG PO CP24
30.0000 mg | ORAL_CAPSULE | Freq: Every morning | ORAL | 0 refills | Status: AC
Start: 2023-10-24 — End: ?
  Filled 2023-11-08 – 2023-11-11 (×2): qty 30, 30d supply, fill #0

## 2023-09-27 MED ORDER — PRAZOSIN HCL 1 MG PO CAPS
1.0000 mg | ORAL_CAPSULE | Freq: Two times a day (BID) | ORAL | 0 refills | Status: DC | PRN
  Filled 2023-09-27: qty 180, 90d supply, fill #0

## 2023-09-27 MED ORDER — AMPHETAMINE-DEXTROAMPHET ER 30 MG PO CP24
30.0000 mg | ORAL_CAPSULE | Freq: Every day | ORAL | 0 refills | Status: AC
Start: 2023-09-26 — End: ?
  Filled 2023-10-13: qty 30, 30d supply, fill #0

## 2023-10-13 ENCOUNTER — Other Ambulatory Visit (HOSPITAL_COMMUNITY): Payer: Self-pay

## 2023-10-13 ENCOUNTER — Other Ambulatory Visit: Payer: Self-pay

## 2023-11-05 ENCOUNTER — Other Ambulatory Visit: Payer: Self-pay | Admitting: Family Medicine

## 2023-11-05 DIAGNOSIS — Z309 Encounter for contraceptive management, unspecified: Secondary | ICD-10-CM

## 2023-11-08 ENCOUNTER — Other Ambulatory Visit: Payer: Self-pay | Admitting: Family Medicine

## 2023-11-08 ENCOUNTER — Other Ambulatory Visit (HOSPITAL_COMMUNITY): Payer: Self-pay

## 2023-11-08 DIAGNOSIS — Z309 Encounter for contraceptive management, unspecified: Secondary | ICD-10-CM

## 2023-11-08 MED ORDER — NORETHIN ACE-ETH ESTRAD-FE 1-20 MG-MCG PO TABS
1.0000 | ORAL_TABLET | Freq: Every day | ORAL | 3 refills | Status: DC
  Filled 2023-11-08: qty 84, 84d supply, fill #0
  Filled 2024-01-27: qty 84, 84d supply, fill #1
  Filled 2024-04-06: qty 84, 84d supply, fill #2
  Filled 2024-07-07: qty 84, 84d supply, fill #3

## 2023-11-08 NOTE — Telephone Encounter (Signed)
 Patient is needing her medication before her appt she would need her birth control called in before her upcoming appt

## 2023-11-08 NOTE — Telephone Encounter (Signed)
 LR 11/08/22, #84, 3 rf LOV  07/09/22 FOV 11/25/23  Please review and advise.  Thanks. Dm/cma

## 2023-11-09 ENCOUNTER — Other Ambulatory Visit: Payer: Self-pay

## 2023-11-10 ENCOUNTER — Other Ambulatory Visit (HOSPITAL_COMMUNITY): Payer: Self-pay

## 2023-11-10 ENCOUNTER — Other Ambulatory Visit: Payer: Self-pay

## 2023-11-10 MED ORDER — AMPHETAMINE-DEXTROAMPHETAMINE 10 MG PO TABS
5.0000 mg | ORAL_TABLET | Freq: Every day | ORAL | 0 refills | Status: DC | PRN
  Filled 2023-11-10 (×2): qty 30, 30d supply, fill #0

## 2023-11-11 ENCOUNTER — Other Ambulatory Visit: Payer: Self-pay

## 2023-11-11 ENCOUNTER — Other Ambulatory Visit (HOSPITAL_COMMUNITY): Payer: Self-pay

## 2023-11-25 ENCOUNTER — Ambulatory Visit: Payer: Self-pay | Admitting: Family Medicine

## 2023-11-25 ENCOUNTER — Telehealth: Payer: Self-pay | Admitting: Family Medicine

## 2023-11-25 ENCOUNTER — Encounter: Payer: Commercial Managed Care - PPO | Admitting: Family Medicine

## 2023-11-25 NOTE — Telephone Encounter (Signed)
Copied from CRM [id number]. Topic: Clinical - Red Word Triage >> Nov 25, 2023  8:12 AM Steele Sizer wrote: Red Word that prompted transfer to Nurse Triage: Pt stated that she is sick and has been having diarrhea, nausea, headache, body aches, fever, dizziness, and slight swelling pt has been having symptoms since Wednesday.  Chief Complaint: pt called in to cancel in office appt today due to not feeling well-agent cancelled in office appt then transferred pt to triage nurse due to patient not feeling well Symptoms: Nausea, abd swelling, body aches, diarrhea, headache, fever, dizziness Frequency: Wednesday Pertinent Negatives: Patient denies SOB Disposition: [] ED /[] Urgent Care (no appt availability in office) / [] Appointment(In office/virtual)/ []  [facility]/ [] Home Care/ [x] Refused Recommended Disposition /[] [facility]/ []  Follow-up with PCP Additional Notes: pt stated she only called to cancel face to face appt - pt did not call in to be triaged or medical advice.  Pt stated treating s/s at home with home remedies & maintaining - hoping sickness will break soon.  Nurse offered virtual appt: pt declined.  Nurse provided further home care advise. Nurse informed pt if no better to call back to schedule appt soon whether face to face or virtual: pt verbalized understanding  Reason for Disposition  [1] Sinus pain of forehead AND [2] yellow or green nasal discharge  Answer Assessment - Initial Assessment Questions 1. LOCATION: "Where does it hurt?"      forehead 2. ONSET: "When did the headache start?" (Minutes, hours or days)      Wednesday 3. PATTERN: "Does the pain come and go, or has it been constant since it started?"     Comes and goes  4. SEVERITY: "How bad is the pain?" and "What does it keep you from doing?"  (e.g., Scale 1-10; mild, moderate, or severe)   - MILD (1-3): doesn't interfere with normal activities    - MODERATE (4-7): interferes with normal activities or  awakens from sleep    - SEVERE (8-10): excruciating pain, unable to do any normal activities        Mild  5. RECURRENT SYMPTOM: "Have you ever had headaches before?" If Yes, ask: "When was the last time?" and "What happened that time?"      no 6. CAUSE: "What do you think is causing the headache?"     cold 7. MIGRAINE: "Have you been diagnosed with migraine headaches?" If Yes, ask: "Is this headache similar?"      no 8. HEAD INJURY: "Has there been any recent injury to the head?"      no 9. OTHER SYMPTOMS: "Do you have any other symptoms?" (fever, stiff neck, eye pain, sore throat, cold symptoms)     Nausea, abd swelling, body aches, diarrhea, headache, fever, dizziness 10. PREGNANCY: "Is there any chance you are pregnant?" "When was your last menstrual period?"       N/a  Protocols used: Headache-A-AH

## 2023-11-25 NOTE — Telephone Encounter (Signed)
Lft VM to rtn call. Dm/cma

## 2023-11-29 NOTE — Telephone Encounter (Signed)
11/25/2023 same day cancel e2c2, pt declined appt stating she was not feeling well and didn't want to come in (per NT enc)  1st missed visit, letter sent via [facility]

## 2023-12-13 DIAGNOSIS — G4700 Insomnia, unspecified: Secondary | ICD-10-CM | POA: Diagnosis not present

## 2023-12-13 DIAGNOSIS — F411 Generalized anxiety disorder: Secondary | ICD-10-CM | POA: Diagnosis not present

## 2023-12-13 DIAGNOSIS — F902 Attention-deficit hyperactivity disorder, combined type: Secondary | ICD-10-CM | POA: Diagnosis not present

## 2023-12-13 DIAGNOSIS — F4310 Post-traumatic stress disorder, unspecified: Secondary | ICD-10-CM | POA: Diagnosis not present

## 2023-12-13 DIAGNOSIS — F339 Major depressive disorder, recurrent, unspecified: Secondary | ICD-10-CM | POA: Diagnosis not present

## 2023-12-14 ENCOUNTER — Other Ambulatory Visit (HOSPITAL_COMMUNITY): Payer: Self-pay

## 2023-12-14 ENCOUNTER — Other Ambulatory Visit: Payer: Self-pay

## 2023-12-14 MED ORDER — ESCITALOPRAM OXALATE 20 MG PO TABS
30.0000 mg | ORAL_TABLET | Freq: Every morning | ORAL | 0 refills | Status: DC
  Filled 2023-12-14: qty 135, 90d supply, fill #0

## 2023-12-14 MED ORDER — CLONIDINE HCL ER 0.1 MG PO TB12
0.3000 mg | ORAL_TABLET | Freq: Every day | ORAL | 0 refills | Status: DC
  Filled 2023-12-14: qty 270, 90d supply, fill #0

## 2023-12-14 MED ORDER — AMPHETAMINE-DEXTROAMPHETAMINE 10 MG PO TABS
5.0000 mg | ORAL_TABLET | Freq: Every day | ORAL | 0 refills | Status: DC
  Filled 2023-12-14: qty 30, 30d supply, fill #0

## 2023-12-14 MED ORDER — ONDANSETRON 8 MG PO TBDP
8.0000 mg | ORAL_TABLET | Freq: Three times a day (TID) | ORAL | 2 refills | Status: DC | PRN
  Filled 2023-12-14: qty 30, 10d supply, fill #0
  Filled 2024-04-06: qty 30, 10d supply, fill #1
  Filled 2024-10-02: qty 30, 10d supply, fill #2

## 2023-12-14 MED ORDER — AMPHETAMINE-DEXTROAMPHET ER 30 MG PO CP24
30.0000 mg | ORAL_CAPSULE | Freq: Every day | ORAL | 0 refills | Status: DC
  Filled 2023-12-14: qty 30, 30d supply, fill #0

## 2024-01-10 ENCOUNTER — Other Ambulatory Visit (HOSPITAL_COMMUNITY): Payer: Self-pay

## 2024-01-10 ENCOUNTER — Other Ambulatory Visit: Payer: Self-pay

## 2024-01-10 DIAGNOSIS — F411 Generalized anxiety disorder: Secondary | ICD-10-CM | POA: Diagnosis not present

## 2024-01-10 DIAGNOSIS — G4700 Insomnia, unspecified: Secondary | ICD-10-CM | POA: Diagnosis not present

## 2024-01-10 DIAGNOSIS — F902 Attention-deficit hyperactivity disorder, combined type: Secondary | ICD-10-CM | POA: Diagnosis not present

## 2024-01-10 DIAGNOSIS — F339 Major depressive disorder, recurrent, unspecified: Secondary | ICD-10-CM | POA: Diagnosis not present

## 2024-01-10 DIAGNOSIS — F4310 Post-traumatic stress disorder, unspecified: Secondary | ICD-10-CM | POA: Diagnosis not present

## 2024-01-10 MED ORDER — AMPHETAMINE-DEXTROAMPHET ER 30 MG PO CP24
30.0000 mg | ORAL_CAPSULE | Freq: Every day | ORAL | 0 refills | Status: DC
  Filled 2024-01-10: qty 30, 30d supply, fill #0

## 2024-01-10 MED ORDER — PRAZOSIN HCL 1 MG PO CAPS
1.0000 mg | ORAL_CAPSULE | Freq: Two times a day (BID) | ORAL | 0 refills | Status: AC | PRN
Start: 2024-01-10 — End: ?
  Filled 2024-01-10: qty 180, 90d supply, fill #0

## 2024-01-10 MED ORDER — AMPHETAMINE-DEXTROAMPHET ER 30 MG PO CP24
30.0000 mg | ORAL_CAPSULE | Freq: Every morning | ORAL | 0 refills | Status: DC
  Filled 2024-02-07: qty 30, 30d supply, fill #0

## 2024-01-10 MED ORDER — AMPHETAMINE-DEXTROAMPHETAMINE 10 MG PO TABS
5.0000 mg | ORAL_TABLET | Freq: Every day | ORAL | 0 refills | Status: DC
Start: 2024-01-10 — End: 2024-02-13
  Filled 2024-01-10: qty 30, 30d supply, fill #0

## 2024-01-11 ENCOUNTER — Other Ambulatory Visit: Payer: Self-pay

## 2024-01-11 ENCOUNTER — Other Ambulatory Visit (HOSPITAL_COMMUNITY): Payer: Self-pay

## 2024-01-12 ENCOUNTER — Other Ambulatory Visit (HOSPITAL_COMMUNITY): Payer: Self-pay

## 2024-01-12 ENCOUNTER — Other Ambulatory Visit: Payer: Self-pay

## 2024-01-13 ENCOUNTER — Other Ambulatory Visit: Payer: Self-pay

## 2024-01-27 ENCOUNTER — Other Ambulatory Visit (HOSPITAL_COMMUNITY): Payer: Self-pay

## 2024-02-07 ENCOUNTER — Other Ambulatory Visit (HOSPITAL_COMMUNITY): Payer: Self-pay

## 2024-02-08 ENCOUNTER — Other Ambulatory Visit: Payer: Self-pay

## 2024-02-08 ENCOUNTER — Other Ambulatory Visit (HOSPITAL_COMMUNITY): Payer: Self-pay

## 2024-02-13 ENCOUNTER — Other Ambulatory Visit: Payer: Self-pay

## 2024-02-13 ENCOUNTER — Other Ambulatory Visit (HOSPITAL_COMMUNITY): Payer: Self-pay

## 2024-02-13 MED ORDER — AMPHETAMINE-DEXTROAMPHETAMINE 10 MG PO TABS
5.0000 mg | ORAL_TABLET | Freq: Every day | ORAL | 0 refills | Status: DC
  Filled 2024-02-13: qty 30, 30d supply, fill #0

## 2024-03-06 ENCOUNTER — Other Ambulatory Visit (HOSPITAL_COMMUNITY): Payer: Self-pay

## 2024-03-06 DIAGNOSIS — F4310 Post-traumatic stress disorder, unspecified: Secondary | ICD-10-CM | POA: Diagnosis not present

## 2024-03-06 DIAGNOSIS — F339 Major depressive disorder, recurrent, unspecified: Secondary | ICD-10-CM | POA: Diagnosis not present

## 2024-03-06 DIAGNOSIS — F411 Generalized anxiety disorder: Secondary | ICD-10-CM | POA: Diagnosis not present

## 2024-03-06 DIAGNOSIS — F902 Attention-deficit hyperactivity disorder, combined type: Secondary | ICD-10-CM | POA: Diagnosis not present

## 2024-03-06 MED ORDER — AMPHETAMINE-DEXTROAMPHETAMINE 10 MG PO TABS
5.0000 mg | ORAL_TABLET | Freq: Every day | ORAL | 0 refills | Status: DC
  Filled 2024-03-08 – 2024-03-12 (×2): qty 30, 30d supply, fill #0

## 2024-03-06 MED ORDER — AMPHETAMINE-DEXTROAMPHET ER 30 MG PO CP24
30.0000 mg | ORAL_CAPSULE | Freq: Every morning | ORAL | 0 refills | Status: DC
  Filled 2024-04-06: qty 30, 30d supply, fill #0

## 2024-03-06 MED ORDER — ESCITALOPRAM OXALATE 20 MG PO TABS
30.0000 mg | ORAL_TABLET | Freq: Every morning | ORAL | 0 refills | Status: DC
  Filled 2024-03-06 – 2024-03-12 (×2): qty 135, 90d supply, fill #0

## 2024-03-06 MED ORDER — AMPHETAMINE-DEXTROAMPHET ER 30 MG PO CP24
30.0000 mg | ORAL_CAPSULE | Freq: Every day | ORAL | 0 refills | Status: DC
  Filled 2024-03-06: qty 30, 30d supply, fill #0

## 2024-03-07 ENCOUNTER — Other Ambulatory Visit (HOSPITAL_COMMUNITY): Payer: Self-pay

## 2024-03-07 ENCOUNTER — Other Ambulatory Visit: Payer: Self-pay

## 2024-03-08 ENCOUNTER — Other Ambulatory Visit: Payer: Self-pay

## 2024-03-08 ENCOUNTER — Other Ambulatory Visit (HOSPITAL_COMMUNITY): Payer: Self-pay

## 2024-03-12 ENCOUNTER — Other Ambulatory Visit (HOSPITAL_COMMUNITY): Payer: Self-pay

## 2024-04-06 ENCOUNTER — Other Ambulatory Visit (HOSPITAL_COMMUNITY): Payer: Self-pay

## 2024-04-06 ENCOUNTER — Other Ambulatory Visit: Payer: Self-pay

## 2024-04-09 ENCOUNTER — Other Ambulatory Visit: Payer: Self-pay

## 2024-04-09 ENCOUNTER — Other Ambulatory Visit (HOSPITAL_COMMUNITY): Payer: Self-pay

## 2024-04-09 MED ORDER — AMPHETAMINE-DEXTROAMPHETAMINE 10 MG PO TABS
5.0000 mg | ORAL_TABLET | Freq: Every day | ORAL | 0 refills | Status: AC | PRN
Start: 2024-04-08 — End: ?
  Filled 2024-04-09: qty 30, 30d supply, fill #0

## 2024-04-26 ENCOUNTER — Other Ambulatory Visit (HOSPITAL_COMMUNITY): Payer: Self-pay

## 2024-05-01 DIAGNOSIS — F902 Attention-deficit hyperactivity disorder, combined type: Secondary | ICD-10-CM | POA: Diagnosis not present

## 2024-05-01 DIAGNOSIS — F411 Generalized anxiety disorder: Secondary | ICD-10-CM | POA: Diagnosis not present

## 2024-05-01 DIAGNOSIS — F339 Major depressive disorder, recurrent, unspecified: Secondary | ICD-10-CM | POA: Diagnosis not present

## 2024-05-03 ENCOUNTER — Encounter: Admitting: Family Medicine

## 2024-05-08 ENCOUNTER — Other Ambulatory Visit (HOSPITAL_COMMUNITY): Payer: Self-pay

## 2024-05-10 ENCOUNTER — Other Ambulatory Visit (HOSPITAL_COMMUNITY): Payer: Self-pay

## 2024-05-10 ENCOUNTER — Other Ambulatory Visit: Payer: Self-pay

## 2024-05-10 MED ORDER — AMPHETAMINE-DEXTROAMPHET ER 30 MG PO CP24
30.0000 mg | ORAL_CAPSULE | Freq: Every day | ORAL | 0 refills | Status: DC
  Filled 2024-05-10: qty 30, 30d supply, fill #0

## 2024-05-10 MED ORDER — AMPHETAMINE-DEXTROAMPHET ER 30 MG PO CP24
30.0000 mg | ORAL_CAPSULE | Freq: Every morning | ORAL | 0 refills | Status: DC
  Filled 2024-06-05 – 2024-06-07 (×2): qty 30, 30d supply, fill #0

## 2024-05-10 MED ORDER — AMPHETAMINE-DEXTROAMPHETAMINE 10 MG PO TABS
5.0000 mg | ORAL_TABLET | ORAL | 0 refills | Status: AC | PRN
Start: 2024-05-09 — End: ?
  Filled 2024-05-10: qty 30, 30d supply, fill #0

## 2024-05-22 ENCOUNTER — Telehealth: Payer: Self-pay | Admitting: Family Medicine

## 2024-05-22 NOTE — Telephone Encounter (Signed)
 11/25/2023 same day cancel e2c2 05/03/2024 same day cancel mychart  Final warning sent via mail and mychart

## 2024-05-23 ENCOUNTER — Other Ambulatory Visit: Payer: Self-pay

## 2024-05-23 ENCOUNTER — Other Ambulatory Visit (HOSPITAL_COMMUNITY): Payer: Self-pay

## 2024-05-23 MED ORDER — ONDANSETRON 8 MG PO TBDP
8.0000 mg | ORAL_TABLET | Freq: Three times a day (TID) | ORAL | 2 refills | Status: DC | PRN
  Filled 2024-05-23: qty 30, 10d supply, fill #0
  Filled 2024-08-03: qty 30, 10d supply, fill #1

## 2024-06-05 ENCOUNTER — Other Ambulatory Visit: Payer: Self-pay

## 2024-06-05 ENCOUNTER — Other Ambulatory Visit (HOSPITAL_COMMUNITY): Payer: Self-pay

## 2024-06-07 ENCOUNTER — Other Ambulatory Visit (HOSPITAL_COMMUNITY): Payer: Self-pay

## 2024-06-07 ENCOUNTER — Other Ambulatory Visit: Payer: Self-pay

## 2024-06-07 MED ORDER — AMPHETAMINE-DEXTROAMPHETAMINE 10 MG PO TABS
5.0000 mg | ORAL_TABLET | Freq: Every day | ORAL | 0 refills | Status: DC
  Filled 2024-06-07 (×2): qty 30, 30d supply, fill #0

## 2024-06-07 MED ORDER — ESCITALOPRAM OXALATE 20 MG PO TABS
30.0000 mg | ORAL_TABLET | Freq: Every morning | ORAL | 0 refills | Status: DC
  Filled 2024-06-07 (×2): qty 135, 90d supply, fill #0

## 2024-06-08 ENCOUNTER — Other Ambulatory Visit: Payer: Self-pay

## 2024-06-21 ENCOUNTER — Other Ambulatory Visit (HOSPITAL_COMMUNITY): Payer: Self-pay

## 2024-07-03 ENCOUNTER — Other Ambulatory Visit (HOSPITAL_COMMUNITY): Payer: Self-pay

## 2024-07-03 ENCOUNTER — Other Ambulatory Visit: Payer: Self-pay

## 2024-07-03 DIAGNOSIS — F902 Attention-deficit hyperactivity disorder, combined type: Secondary | ICD-10-CM | POA: Diagnosis not present

## 2024-07-03 DIAGNOSIS — F411 Generalized anxiety disorder: Secondary | ICD-10-CM | POA: Diagnosis not present

## 2024-07-03 DIAGNOSIS — F4310 Post-traumatic stress disorder, unspecified: Secondary | ICD-10-CM | POA: Diagnosis not present

## 2024-07-03 DIAGNOSIS — F339 Major depressive disorder, recurrent, unspecified: Secondary | ICD-10-CM | POA: Diagnosis not present

## 2024-07-03 MED ORDER — AMPHETAMINE-DEXTROAMPHET ER 30 MG PO CP24
30.0000 mg | ORAL_CAPSULE | Freq: Every morning | ORAL | 0 refills | Status: AC
Start: 2024-07-31 — End: ?
  Filled 2024-08-03: qty 30, 30d supply, fill #0

## 2024-07-03 MED ORDER — AMPHETAMINE-DEXTROAMPHET ER 30 MG PO CP24
30.0000 mg | ORAL_CAPSULE | Freq: Every day | ORAL | 0 refills | Status: AC
Start: 2024-07-03 — End: ?
  Filled 2024-07-04: qty 30, 30d supply, fill #0

## 2024-07-03 MED ORDER — CLONAZEPAM 0.5 MG PO TABS
0.2500 mg | ORAL_TABLET | Freq: Four times a day (QID) | ORAL | 0 refills | Status: DC | PRN
  Filled 2024-07-03: qty 10, 30d supply, fill #0

## 2024-07-03 MED ORDER — AMPHETAMINE-DEXTROAMPHETAMINE 10 MG PO TABS
5.0000 mg | ORAL_TABLET | Freq: Every day | ORAL | 0 refills | Status: DC
  Filled 2024-07-03 – 2024-07-07 (×2): qty 30, 30d supply, fill #0

## 2024-07-03 MED ORDER — CLONIDINE HCL ER 0.1 MG PO TB12
0.3000 mg | ORAL_TABLET | Freq: Every day | ORAL | 0 refills | Status: DC
  Filled 2024-07-03: qty 270, 90d supply, fill #0

## 2024-07-03 MED ORDER — AMPHETAMINE-DEXTROAMPHET ER 30 MG PO CP24
30.0000 mg | ORAL_CAPSULE | Freq: Every morning | ORAL | 0 refills | Status: DC
  Filled 2024-09-05: qty 30, 30d supply, fill #0

## 2024-07-03 MED ORDER — PRAZOSIN HCL 1 MG PO CAPS
1.0000 mg | ORAL_CAPSULE | Freq: Two times a day (BID) | ORAL | 0 refills | Status: AC | PRN
Start: 2024-07-03 — End: ?
  Filled 2024-07-03: qty 180, 90d supply, fill #0

## 2024-07-04 ENCOUNTER — Other Ambulatory Visit: Payer: Self-pay

## 2024-07-04 ENCOUNTER — Other Ambulatory Visit (HOSPITAL_COMMUNITY): Payer: Self-pay

## 2024-07-05 ENCOUNTER — Other Ambulatory Visit: Payer: Self-pay

## 2024-07-07 ENCOUNTER — Other Ambulatory Visit (HOSPITAL_COMMUNITY): Payer: Self-pay

## 2024-07-09 ENCOUNTER — Other Ambulatory Visit: Payer: Self-pay

## 2024-08-03 ENCOUNTER — Other Ambulatory Visit: Payer: Self-pay

## 2024-08-03 ENCOUNTER — Other Ambulatory Visit (HOSPITAL_COMMUNITY): Payer: Self-pay

## 2024-08-06 ENCOUNTER — Other Ambulatory Visit: Payer: Self-pay

## 2024-08-06 ENCOUNTER — Other Ambulatory Visit (HOSPITAL_COMMUNITY): Payer: Self-pay

## 2024-08-06 MED ORDER — AMPHETAMINE-DEXTROAMPHETAMINE 10 MG PO TABS
5.0000 mg | ORAL_TABLET | Freq: Every day | ORAL | 0 refills | Status: AC
Start: 2024-08-06 — End: ?
  Filled 2024-08-06: qty 30, 30d supply, fill #0

## 2024-09-05 ENCOUNTER — Other Ambulatory Visit: Payer: Self-pay

## 2024-09-05 ENCOUNTER — Other Ambulatory Visit (HOSPITAL_BASED_OUTPATIENT_CLINIC_OR_DEPARTMENT_OTHER): Payer: Self-pay

## 2024-09-05 ENCOUNTER — Other Ambulatory Visit (HOSPITAL_COMMUNITY): Payer: Self-pay

## 2024-09-05 MED ORDER — ESCITALOPRAM OXALATE 20 MG PO TABS
30.0000 mg | ORAL_TABLET | Freq: Every morning | ORAL | 0 refills | Status: DC
  Filled 2024-09-05: qty 15, 10d supply, fill #0

## 2024-09-05 MED ORDER — AMPHETAMINE-DEXTROAMPHETAMINE 10 MG PO TABS
5.0000 mg | ORAL_TABLET | Freq: Every day | ORAL | 0 refills | Status: DC | PRN
  Filled 2024-09-05: qty 10, 10d supply, fill #0

## 2024-09-07 ENCOUNTER — Other Ambulatory Visit (HOSPITAL_BASED_OUTPATIENT_CLINIC_OR_DEPARTMENT_OTHER): Payer: Self-pay

## 2024-09-07 ENCOUNTER — Other Ambulatory Visit (HOSPITAL_COMMUNITY): Payer: Self-pay

## 2024-09-07 MED ORDER — AMPHETAMINE-DEXTROAMPHET ER 30 MG PO CP24
30.0000 mg | ORAL_CAPSULE | Freq: Every morning | ORAL | 0 refills | Status: AC
  Filled 2024-10-02: qty 10, 10d supply, fill #0

## 2024-09-11 ENCOUNTER — Other Ambulatory Visit (HOSPITAL_COMMUNITY): Payer: Self-pay

## 2024-09-17 ENCOUNTER — Other Ambulatory Visit (HOSPITAL_COMMUNITY): Payer: Self-pay

## 2024-09-17 DIAGNOSIS — F902 Attention-deficit hyperactivity disorder, combined type: Secondary | ICD-10-CM | POA: Diagnosis not present

## 2024-09-17 DIAGNOSIS — G4700 Insomnia, unspecified: Secondary | ICD-10-CM | POA: Diagnosis not present

## 2024-09-17 DIAGNOSIS — F339 Major depressive disorder, recurrent, unspecified: Secondary | ICD-10-CM | POA: Diagnosis not present

## 2024-09-17 DIAGNOSIS — F411 Generalized anxiety disorder: Secondary | ICD-10-CM | POA: Diagnosis not present

## 2024-09-18 ENCOUNTER — Other Ambulatory Visit (HOSPITAL_COMMUNITY): Payer: Self-pay

## 2024-09-18 MED ORDER — AMPHETAMINE-DEXTROAMPHET ER 30 MG PO CP24
30.0000 mg | ORAL_CAPSULE | Freq: Every morning | ORAL | 0 refills | Status: AC
  Filled 2024-09-18 – 2024-10-03 (×2): qty 30, 30d supply, fill #0

## 2024-09-18 MED ORDER — ESCITALOPRAM OXALATE 20 MG PO TABS
30.0000 mg | ORAL_TABLET | Freq: Every morning | ORAL | 1 refills | Status: DC
  Filled 2024-09-18: qty 45, 30d supply, fill #0
  Filled 2024-10-16: qty 45, 30d supply, fill #1

## 2024-09-18 MED ORDER — AMPHETAMINE-DEXTROAMPHETAMINE 10 MG PO TABS
5.0000 mg | ORAL_TABLET | Freq: Every day | ORAL | 0 refills | Status: AC
Start: 2024-09-17 — End: ?
  Filled 2024-09-18: qty 30, 30d supply, fill #0

## 2024-10-02 ENCOUNTER — Other Ambulatory Visit: Payer: Self-pay | Admitting: Family Medicine

## 2024-10-02 DIAGNOSIS — Z309 Encounter for contraceptive management, unspecified: Secondary | ICD-10-CM

## 2024-10-03 ENCOUNTER — Other Ambulatory Visit (HOSPITAL_COMMUNITY): Payer: Self-pay

## 2024-10-03 ENCOUNTER — Other Ambulatory Visit: Payer: Self-pay

## 2024-10-03 MED ORDER — TARINA FE 1/20 EQ 1-20 MG-MCG PO TABS
1.0000 | ORAL_TABLET | Freq: Every day | ORAL | 0 refills | Status: DC
  Filled 2024-10-03: qty 28, 28d supply, fill #0

## 2024-10-03 NOTE — Telephone Encounter (Signed)
 Scheduled 11/10/23.  Dm/cma

## 2024-10-04 ENCOUNTER — Other Ambulatory Visit: Payer: Self-pay

## 2024-10-16 ENCOUNTER — Other Ambulatory Visit: Payer: Self-pay

## 2024-10-16 ENCOUNTER — Other Ambulatory Visit (HOSPITAL_COMMUNITY): Payer: Self-pay

## 2024-10-18 ENCOUNTER — Ambulatory Visit (INDEPENDENT_AMBULATORY_CARE_PROVIDER_SITE_OTHER): Admitting: Family Medicine

## 2024-10-18 ENCOUNTER — Encounter: Payer: Self-pay | Admitting: Family Medicine

## 2024-10-18 VITALS — BP 118/66 | HR 118 | Temp 98.1°F | Ht 65.5 in | Wt 155.6 lb

## 2024-10-18 DIAGNOSIS — E282 Polycystic ovarian syndrome: Secondary | ICD-10-CM | POA: Diagnosis not present

## 2024-10-18 DIAGNOSIS — Z6832 Body mass index (BMI) 32.0-32.9, adult: Secondary | ICD-10-CM | POA: Diagnosis not present

## 2024-10-18 DIAGNOSIS — Z13220 Encounter for screening for lipoid disorders: Secondary | ICD-10-CM

## 2024-10-18 DIAGNOSIS — E6609 Other obesity due to excess calories: Secondary | ICD-10-CM

## 2024-10-18 DIAGNOSIS — Z23 Encounter for immunization: Secondary | ICD-10-CM | POA: Diagnosis not present

## 2024-10-18 DIAGNOSIS — E66811 Obesity, class 1: Secondary | ICD-10-CM

## 2024-10-18 DIAGNOSIS — L700 Acne vulgaris: Secondary | ICD-10-CM

## 2024-10-18 DIAGNOSIS — F902 Attention-deficit hyperactivity disorder, combined type: Secondary | ICD-10-CM | POA: Diagnosis not present

## 2024-10-18 DIAGNOSIS — Z0000 Encounter for general adult medical examination without abnormal findings: Secondary | ICD-10-CM | POA: Diagnosis not present

## 2024-10-18 LAB — BASIC METABOLIC PANEL WITH GFR
BUN: 7 mg/dL (ref 6–23)
CO2: 26 meq/L (ref 19–32)
Calcium: 8.8 mg/dL (ref 8.4–10.5)
Chloride: 104 meq/L (ref 96–112)
Creatinine, Ser: 0.65 mg/dL (ref 0.40–1.20)
GFR: 117.69 mL/min (ref >60.00)
Glucose, Bld: 100 mg/dL — ABNORMAL HIGH (ref 70–99)
Potassium: 4.1 meq/L (ref 3.5–5.1)
Sodium: 137 meq/L (ref 135–145)

## 2024-10-18 LAB — LIPID PANEL
Cholesterol: 152 mg/dL (ref 28–200)
HDL: 46.8 mg/dL (ref >39.00)
LDL Cholesterol: 93 mg/dL (ref 10–99)
NonHDL: 104.8
Total CHOL/HDL Ratio: 3
Triglycerides: 57 mg/dL (ref 10.0–149.0)
VLDL: 11.4 mg/dL (ref 0.0–40.0)

## 2024-10-18 LAB — HEMOGLOBIN A1C: Hgb A1c MFr Bld: 5.4 % (ref 4.6–6.5)

## 2024-10-18 NOTE — Progress Notes (Signed)
 [facility] [address] [place] [address] Dept: [phone] Dept Fax: [phone]  Annual Physical Visit  Subjective:    Patient ID: Brittany Frazier, female    DOB: [birth year], 33 y.o..   MRN: [id number]  Chief Complaint  Patient presents with   Annual Exam    CPE/labs.  C/o having hip pain off/on and acne.    History of Present Illness:  Patient is in today for an annual physical/preventative visit.  Brittany Frazier has a history of depression with an occasional panic attack and PTSD. She is managed on escitalopram  20 mg daily, clonidine  0.1 mg bid, and prazosin  1 mg at bedtime. She also has a history of ADHD. She is managed on Adderall  XR 30 mg daily.   Brittany Frazier has a history of obesity. She has been managed on compounded semaglutide . She is currently on 2.5 mg weekly. She notes her starting weight as 212 lbs. Her goal weight is 145-150 lbs.  Review of Systems  Constitutional:  Negative for chills, diaphoresis, fever, malaise/fatigue and weight loss.  HENT:  Negative for congestion, ear pain, hearing loss, sinus pain, sore throat and tinnitus.   Eyes:  Negative for blurred vision, pain, discharge and redness.  Respiratory:  Negative for cough, shortness of breath and wheezing.   Cardiovascular:  Negative for chest pain and palpitations.  Gastrointestinal:  Negative for abdominal pain, constipation, diarrhea, heartburn, nausea and vomiting.  Musculoskeletal:  Positive for joint pain. Negative for back pain and myalgias.       Had a few day history of left buttocks pain associated with a day of intense physical activity. This is improved currently.  Skin:  Positive for rash. Negative for itching.       Brittany Frazier has a history of acne. She is currently using a tretinoin  cream (0.5% ?).  Psychiatric/Behavioral:  Negative for depression. The patient is not nervous/anxious.     Past Medical History: Patient Active  Problem List   Diagnosis Date Noted   Major depressive disorder, recurrent 02/09/2022   Posttraumatic stress disorder 02/09/2022   Tobacco use 10/07/2021   Attention deficit hyperactivity disorder, combined type 10/07/2021   Acne vulgaris 07/14/2021   Generalized anxiety disorder 07/14/2021   Class 1 obesity due to excess calories with body mass index (BMI) of 32.0 to 32.9 in adult 07/14/2021   Elevated testosterone  level 11/16/2019   Hirsutism 11/16/2019   Past Surgical History:  Procedure Laterality Date   HERNIA REPAIR Bilateral    Family History  Problem Relation Age of Onset   Thyroid  disease Mother    Stroke Maternal Uncle    Cancer Paternal Uncle        Colon   Heart disease Maternal Grandmother    Stroke Maternal Grandfather    Diabetes Maternal Grandfather    Heart disease Maternal Grandfather    Diabetes Paternal Grandfather    Stroke Paternal Grandfather     Allergies[1] Objective:   Today's Vitals   10/18/24 1014  BP: 118/66  Pulse: (!) 118  Temp: 98.1 F (36.7 C)  TempSrc: Temporal  SpO2: 100%  Weight: 155 lb 9.6 oz (70.6 kg)  Height: 5' 5.5 (1.664 m)   Body mass index is 25.5 kg/m.   General: Well developed, well nourished. No acute distress. HEENT: Normocephalic, non-traumatic. PERRL, EOMI. Conjunctiva clear. External ears normal. EAC and TMs   normal bilaterally. Nose clear without congestion or rhinorrhea. Mucous membranes moist. Oropharynx clear. Good  dentition. Neck: Supple. No lymphadenopathy. No thyromegaly. Lungs: Clear to auscultation bilaterally. No wheezing, rales or rhonchi. CV: RRR without murmurs or rubs. Pulses 2+ bilaterally. Abdomen: Soft, non-tender. Bowel sounds positive, normal pitch and frequency. No hepatosplenomegaly. No   rebound or guarding. Extremities: Full ROM. No joint swelling or tenderness. No edema noted. Skin: Warm and dry. There are multiple scars on the face c/w prior acne. Psych: Alert and oriented. Normal  mood and affect.  Health Maintenance Due  Topic Date Due   Hepatitis B Vaccines 19-59 Average Risk (3 of 3 - 3-dose series) 09/28/1995   Hepatitis C Screening  Never done   Pneumococcal Vaccine (1 of 2 - PCV) Never done   HPV VACCINES (1 - 3-dose SCDM series) Never done   Cervical Cancer Screening (HPV/Pap Cotest)  10/29/2023     Assessment & Plan:   Problem List Items Addressed This Visit       Endocrine   PCOS (polycystic ovarian syndrome)   Brittany Frazier has been able to lose significant weight. This will help to reduce co-morbidities associated with her PCOS. She remains on a COC for regulation of her menses. I will check her A1c and blood sugar today.      Relevant Orders   Basic metabolic panel with GFR   Hemoglobin A1c     Musculoskeletal and Integument   Acne vulgaris   Marked scarring associated with acne. I will refer her to dermatology.      Relevant Orders   Ambulatory referral to Dermatology     Other   Annual physical exam - Primary   Overall health is very good. Recommend she engage in regular exercise. Discussed recommended screenings and immunizations. She prefers to see a GYN for her pap smear. She agrees to call about an appointment.       Attention deficit hyperactivity disorder, combined type   Managed by behavioral health. Continue Adderall  XR 30 mg daily.       Class 1 obesity due to excess calories with body mass index (BMI) of 32.0 to 32.9 in adult   Maximum weight: 212 lbs (08/2022) Current weight: 155 lbs Weight change since last visit: -45 lbs Total weight loss: 67 lbs (31.6 %)  Brittany Frazier has been quite successful with weight loss on semaglutide . I did discuss current options to receiving Wegovy  directly from Novo Nordisk. This may save her money related to her current approach. She will let me know if she wants to move ahead with this option.       Other Visit Diagnoses       Screening for lipid disorders       Relevant Orders    Lipid panel     Need for Tdap vaccination       Relevant Orders   Tdap vaccine greater than or equal to 7yo IM (Completed)       Return in about 1 year (around 10/18/2025) for Annual preventative care.   Garnette CHRISTELLA Simpler, MD  I,Emily Lagle,acting as a scribe for Garnette CHRISTELLA Simpler, MD.,have documented all relevant documentation on the behalf of Garnette CHRISTELLA Simpler, MD.  I, Garnette CHRISTELLA Simpler, MD, have reviewed all documentation for this visit. The documentation on 10/18/2024 for the exam, diagnosis, procedures, and orders are all accurate and complete.     [1]  Allergies Allergen Reactions   Benzoyl Peroxide Rash   Sulfa Antibiotics Rash

## 2024-10-18 NOTE — Assessment & Plan Note (Signed)
 Marked scarring associated with acne. I will refer her to dermatology.

## 2024-10-18 NOTE — Assessment & Plan Note (Signed)
 Brittany Frazier has been able to lose significant weight. This will help to reduce co-morbidities associated with her PCOS. She remains on a COC for regulation of her menses. I will check her A1c and blood sugar today.

## 2024-10-18 NOTE — Assessment & Plan Note (Addendum)
 Managed by behavioral health. Continue Adderall  XR 30 mg daily.

## 2024-10-18 NOTE — Assessment & Plan Note (Signed)
 Overall health is very good. Recommend she engage in regular exercise. Discussed recommended screenings and immunizations. She prefers to see a GYN for her pap smear. She agrees to call about an appointment.

## 2024-10-18 NOTE — Assessment & Plan Note (Signed)
 Maximum weight: 212 lbs (08/2022) Current weight: 155 lbs Weight change since last visit: -45 lbs Total weight loss: 67 lbs (31.6 %)  Brittany Frazier has been quite successful with weight loss on semaglutide . I did discuss current options to receiving Wegovy  directly from Novo Nordisk. This may save her money related to her current approach. She will let me know if she wants to move ahead with this option.

## 2024-10-19 ENCOUNTER — Ambulatory Visit: Payer: Self-pay | Admitting: Family Medicine

## 2024-10-19 MED ORDER — AMPHETAMINE-DEXTROAMPHETAMINE 10 MG PO TABS
5.0000 mg | ORAL_TABLET | Freq: Every day | ORAL | 0 refills | Status: AC
  Filled 2024-10-19: qty 30, 30d supply, fill #0

## 2024-10-30 ENCOUNTER — Other Ambulatory Visit: Payer: Self-pay | Admitting: Family Medicine

## 2024-10-30 DIAGNOSIS — Z309 Encounter for contraceptive management, unspecified: Secondary | ICD-10-CM

## 2024-10-31 ENCOUNTER — Other Ambulatory Visit: Payer: Self-pay

## 2024-10-31 ENCOUNTER — Other Ambulatory Visit (HOSPITAL_COMMUNITY): Payer: Self-pay

## 2024-10-31 MED ORDER — TARINA FE 1/20 EQ 1-20 MG-MCG PO TABS
1.0000 | ORAL_TABLET | Freq: Every day | ORAL | 3 refills | Status: AC
  Filled 2024-10-31: qty 84, 84d supply, fill #0

## 2024-11-02 ENCOUNTER — Other Ambulatory Visit (HOSPITAL_COMMUNITY): Payer: Self-pay

## 2024-11-06 ENCOUNTER — Other Ambulatory Visit: Payer: Self-pay

## 2024-11-06 ENCOUNTER — Other Ambulatory Visit (HOSPITAL_COMMUNITY): Payer: Self-pay

## 2024-11-06 MED ORDER — AMPHETAMINE-DEXTROAMPHET ER 30 MG PO CP24
30.0000 mg | ORAL_CAPSULE | Freq: Every morning | ORAL | 0 refills | Status: AC
  Filled 2024-11-06: qty 7, 7d supply, fill #0

## 2024-11-09 ENCOUNTER — Encounter: Admitting: Family Medicine

## 2024-11-13 ENCOUNTER — Other Ambulatory Visit (HOSPITAL_COMMUNITY): Payer: Self-pay

## 2024-11-13 ENCOUNTER — Other Ambulatory Visit: Payer: Self-pay

## 2024-11-13 MED ORDER — ESCITALOPRAM OXALATE 20 MG PO TABS
30.0000 mg | ORAL_TABLET | Freq: Every morning | ORAL | 0 refills | Status: AC
  Filled 2024-11-13 (×2): qty 135, 90d supply, fill #0

## 2024-11-13 MED ORDER — AMPHETAMINE-DEXTROAMPHET ER 30 MG PO CP24
30.0000 mg | ORAL_CAPSULE | Freq: Every morning | ORAL | 0 refills | Status: AC
  Filled 2024-11-13: qty 30, 30d supply, fill #0

## 2024-11-13 MED ORDER — AMPHETAMINE-DEXTROAMPHETAMINE 10 MG PO TABS
5.0000 mg | ORAL_TABLET | Freq: Every day | ORAL | 0 refills | Status: AC
  Filled 2024-11-13 – 2024-11-15 (×2): qty 30, 30d supply, fill #0

## 2024-11-13 MED ORDER — PRAZOSIN HCL 1 MG PO CAPS
1.0000 mg | ORAL_CAPSULE | Freq: Two times a day (BID) | ORAL | 0 refills | Status: AC | PRN
  Filled 2024-11-13: qty 180, 90d supply, fill #0

## 2024-11-13 MED ORDER — CLONIDINE HCL ER 0.1 MG PO TB12
0.3000 mg | ORAL_TABLET | Freq: Every day | ORAL | 0 refills | Status: AC
  Filled 2024-11-13: qty 270, 90d supply, fill #0

## 2024-11-13 MED ORDER — AMPHETAMINE-DEXTROAMPHET ER 30 MG PO CP24
30.0000 mg | ORAL_CAPSULE | Freq: Every day | ORAL | 0 refills | Status: AC
  Filled 2024-11-15: qty 30, 30d supply, fill #0

## 2024-11-13 MED ORDER — AMPHETAMINE-DEXTROAMPHET ER 30 MG PO CP24
30.0000 mg | ORAL_CAPSULE | Freq: Every morning | ORAL | 0 refills | Status: AC
  Filled 2024-11-15: qty 30, 30d supply, fill #0

## 2024-11-14 ENCOUNTER — Other Ambulatory Visit: Payer: Self-pay

## 2024-11-15 ENCOUNTER — Other Ambulatory Visit (HOSPITAL_COMMUNITY): Payer: Self-pay

## 2024-11-29 ENCOUNTER — Other Ambulatory Visit (HOSPITAL_COMMUNITY): Payer: Self-pay

## 2024-11-29 MED ORDER — ONDANSETRON 8 MG PO TBDP
8.0000 mg | ORAL_TABLET | Freq: Three times a day (TID) | ORAL | 2 refills | Status: AC | PRN
  Filled 2024-11-29 (×2): qty 30, 10d supply, fill #0

## 2025-06-17 ENCOUNTER — Ambulatory Visit: Admitting: Physician Assistant
